# Patient Record
Sex: Female | Born: 1985 | Race: White | Hispanic: No | Marital: Single | Smoking: Never smoker
Health system: Southern US, Community
[De-identification: ages and names within clinical notes are randomized; demographics above are authoritative.]

## PROBLEM LIST (undated history)

## (undated) DIAGNOSIS — I10 Essential (primary) hypertension: Secondary | ICD-10-CM

## (undated) DIAGNOSIS — F419 Anxiety disorder, unspecified: Secondary | ICD-10-CM

## (undated) DIAGNOSIS — E039 Hypothyroidism, unspecified: Secondary | ICD-10-CM

## (undated) HISTORY — DX: Hypothyroidism, unspecified: E03.9

## (undated) HISTORY — PX: CONJUNCTIVA BIOPSY: SHX352

## (undated) HISTORY — DX: Essential (primary) hypertension: I10

## (undated) HISTORY — DX: Anxiety disorder, unspecified: F41.9

---

## 2015-06-17 ENCOUNTER — Encounter (HOSPITAL_COMMUNITY): Payer: Self-pay | Admitting: Adult Health

## 2015-06-17 ENCOUNTER — Emergency Department (HOSPITAL_COMMUNITY)
Admission: EM | Admit: 2015-06-17 | Discharge: 2015-06-17 | Disposition: A | Discharge status: Home / Self Care | Payer: No Typology Code available for payment source | Attending: Emergency Medicine | Admitting: Emergency Medicine

## 2015-06-17 ENCOUNTER — Emergency Department (HOSPITAL_COMMUNITY): Payer: No Typology Code available for payment source

## 2015-06-17 DIAGNOSIS — Z79899 Other long term (current) drug therapy: Secondary | ICD-10-CM | POA: Diagnosis not present

## 2015-06-17 DIAGNOSIS — Y92410 Unspecified street and highway as the place of occurrence of the external cause: Secondary | ICD-10-CM | POA: Insufficient documentation

## 2015-06-17 DIAGNOSIS — S4991XA Unspecified injury of right shoulder and upper arm, initial encounter: Secondary | ICD-10-CM | POA: Insufficient documentation

## 2015-06-17 DIAGNOSIS — Y998 Other external cause status: Secondary | ICD-10-CM | POA: Insufficient documentation

## 2015-06-17 DIAGNOSIS — Y9389 Activity, other specified: Secondary | ICD-10-CM | POA: Insufficient documentation

## 2015-06-17 DIAGNOSIS — S4992XA Unspecified injury of left shoulder and upper arm, initial encounter: Secondary | ICD-10-CM | POA: Diagnosis present

## 2015-06-17 DIAGNOSIS — S40212A Abrasion of left shoulder, initial encounter: Secondary | ICD-10-CM | POA: Diagnosis not present

## 2015-06-17 DIAGNOSIS — S199XXA Unspecified injury of neck, initial encounter: Secondary | ICD-10-CM | POA: Diagnosis not present

## 2015-06-17 DIAGNOSIS — Z3202 Encounter for pregnancy test, result negative: Secondary | ICD-10-CM | POA: Diagnosis not present

## 2015-06-17 LAB — URINE MICROSCOPIC-ADD ON: WBC, UA: NONE SEEN WBC/hpf (ref 0–5)

## 2015-06-17 LAB — URINALYSIS, ROUTINE W REFLEX MICROSCOPIC
BILIRUBIN URINE: NEGATIVE
GLUCOSE, UA: NEGATIVE mg/dL
Ketones, ur: NEGATIVE mg/dL
Leukocytes, UA: NEGATIVE
Nitrite: NEGATIVE
PH: 5.5 (ref 5.0–8.0)
Protein, ur: 30 mg/dL — AB
SPECIFIC GRAVITY, URINE: 1.029 (ref 1.005–1.030)

## 2015-06-17 LAB — POC URINE PREG, ED: PREG TEST UR: NEGATIVE
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
## 2021-08-13 DIAGNOSIS — E876 Hypokalemia: Secondary | ICD-10-CM | POA: Insufficient documentation

## 2021-08-13 DIAGNOSIS — E871 Hypo-osmolality and hyponatremia: Secondary | ICD-10-CM | POA: Insufficient documentation

## 2021-08-13 DIAGNOSIS — I10 Essential (primary) hypertension: Secondary | ICD-10-CM

## 2021-08-13 DIAGNOSIS — F1110 Opioid abuse, uncomplicated: Secondary | ICD-10-CM

## 2021-08-13 DIAGNOSIS — K859 Acute pancreatitis without necrosis or infection, unspecified: Secondary | ICD-10-CM

## 2021-08-13 DIAGNOSIS — I471 Supraventricular tachycardia, unspecified: Secondary | ICD-10-CM

## 2021-08-13 DIAGNOSIS — E861 Hypovolemia: Secondary | ICD-10-CM | POA: Insufficient documentation

## 2021-08-13 DIAGNOSIS — A419 Sepsis, unspecified organism: Secondary | ICD-10-CM

## 2021-08-13 DIAGNOSIS — J9601 Acute respiratory failure with hypoxia: Secondary | ICD-10-CM | POA: Insufficient documentation

## 2021-08-13 DIAGNOSIS — F10930 Alcohol use, unspecified with withdrawal, uncomplicated: Secondary | ICD-10-CM | POA: Insufficient documentation

## 2021-08-13 DIAGNOSIS — Z87898 Personal history of other specified conditions: Secondary | ICD-10-CM

## 2021-08-13 DIAGNOSIS — E872 Acidosis, unspecified: Secondary | ICD-10-CM

## 2021-08-13 DIAGNOSIS — E781 Pure hyperglyceridemia: Secondary | ICD-10-CM

## 2021-08-13 DIAGNOSIS — F1010 Alcohol abuse, uncomplicated: Secondary | ICD-10-CM | POA: Insufficient documentation

## 2021-08-13 DIAGNOSIS — N179 Acute kidney failure, unspecified: Secondary | ICD-10-CM

## 2021-08-13 HISTORY — DX: Pure hyperglyceridemia: E78.1

## 2021-08-13 HISTORY — DX: Acute respiratory failure with hypoxia: J96.01

## 2021-08-13 HISTORY — DX: Alcohol abuse, uncomplicated: F10.10

## 2021-08-13 HISTORY — DX: Sepsis, unspecified organism: A41.9

## 2021-08-13 HISTORY — DX: Essential (primary) hypertension: I10

## 2021-08-13 HISTORY — DX: Hypocalcemia: E83.51

## 2021-08-13 HISTORY — DX: Alcohol use, unspecified with withdrawal, uncomplicated: F10.930

## 2021-08-13 HISTORY — DX: Hypo-osmolality and hyponatremia: E87.1

## 2021-08-13 HISTORY — DX: Acute kidney failure, unspecified: N17.9

## 2021-08-13 HISTORY — DX: Hypokalemia: E87.6

## 2021-08-13 HISTORY — DX: Acute pancreatitis without necrosis or infection, unspecified: K85.90

## 2021-08-13 HISTORY — DX: Supraventricular tachycardia, unspecified: I47.10

## 2021-08-13 HISTORY — DX: Hypomagnesemia: E83.42

## 2021-08-13 HISTORY — DX: Opioid abuse, uncomplicated: F11.10

## 2021-08-13 HISTORY — DX: Hypovolemia: E86.1

## 2021-08-13 HISTORY — DX: Other disorders of phosphorus metabolism: E83.39

## 2021-08-13 HISTORY — DX: Personal history of other specified conditions: Z87.898

## 2021-08-13 HISTORY — DX: Acidosis, unspecified: E87.20

## 2021-10-15 DIAGNOSIS — R1012 Left upper quadrant pain: Secondary | ICD-10-CM | POA: Insufficient documentation

## 2021-10-15 DIAGNOSIS — R109 Unspecified abdominal pain: Secondary | ICD-10-CM | POA: Insufficient documentation

## 2021-10-15 DIAGNOSIS — K8591 Acute pancreatitis with uninfected necrosis, unspecified: Secondary | ICD-10-CM | POA: Insufficient documentation

## 2021-10-15 DIAGNOSIS — F32A Depression, unspecified: Secondary | ICD-10-CM

## 2021-10-15 DIAGNOSIS — I81 Portal vein thrombosis: Secondary | ICD-10-CM | POA: Insufficient documentation

## 2021-10-15 DIAGNOSIS — K863 Pseudocyst of pancreas: Secondary | ICD-10-CM | POA: Insufficient documentation

## 2021-10-15 HISTORY — DX: Acute pancreatitis with uninfected necrosis, unspecified: K85.91

## 2021-10-15 HISTORY — DX: Unspecified abdominal pain: R10.9

## 2021-10-15 HISTORY — DX: Left upper quadrant pain: R10.12

## 2021-10-15 HISTORY — DX: Portal vein thrombosis: I81

## 2021-10-15 HISTORY — DX: Depression, unspecified: F32.A

## 2021-10-15 HISTORY — DX: Pseudocyst of pancreas: K86.3

## 2022-06-10 ENCOUNTER — Encounter: Payer: Self-pay | Admitting: *Deleted

## 2022-06-10 ENCOUNTER — Encounter: Payer: Self-pay | Admitting: Cardiology

## 2022-07-02 DIAGNOSIS — F419 Anxiety disorder, unspecified: Secondary | ICD-10-CM | POA: Insufficient documentation

## 2022-07-02 DIAGNOSIS — E039 Hypothyroidism, unspecified: Secondary | ICD-10-CM | POA: Insufficient documentation

## 2022-07-09 NOTE — Progress Notes (Deleted)
Cardiology Office Note:    Date:  07/09/2022   ID:  Gina Gray, DOB [birth year], MRN [id number]  PCP:  Erskine Emery, NP  Cardiologist:  Norman Herrlich, MD   Referring MD: Erskine Emery, NP  ASSESSMENT:    1. APC (atrial premature contractions)   2. PVC's (premature ventricular contractions)   3. Takotsubo cardiomyopathy   4. Hypertensive heart disease without heart failure   5. Alcohol abuse    PLAN:    In order of problems listed above:  ***  Next appointment   Medication Adjustments/Labs and Tests Ordered: Current medicines are reviewed at length with the patient today.  Concerns regarding medicines are outlined above.  No orders of the defined types were placed in this encounter.  No orders of the defined types were placed in this encounter.    No chief complaint on file. ***  History of Present Illness:    Gina Gray is a 37 y.o. female who is being seen today for the evaluation of arrhythmia at the request of Erskine Emery, NP.  She has a history of alcohol abuse alcohol withdrawal syndrome pancreatitis hypertension hyper lipidemia SVT electrolyte abnormalities  Chart review shows an EKG 08/13/2021 with marked sinus tachycardia 143 bpm right axis deviation with left posterior fascicular block nonspecific ST changes.  An echocardiogram at [facility] 08/14/2021 showing LV dysfunction EF 45 to 50% with images suggesting Takotsubo syndrome or stress-induced cardiomyopathy mid ventricular variant.  Problem was not noted in the discharge summary does not appear to have been seen by cardiology  She was seen with her primary care physician 06/06/2018 for.  Complaint was recurrent upper abdomen and back pain similar to previous pancreatitis.  It was noted that she had sinus arrhythmia PVCs and had an event monitor placed by her primary care physician laboratory studies showed a potassium 4.3 sodium 138 creatinine 0.7 GFR 114 cc/min.  Cholesterol 209 triglycerides  303 LDL 110 HDL 38 lipase level was low at 4 CBC showed hemoglobin of 12.7 platelet count normal 239,000 BUN.  A BNP level was performed which was low at 108.5 the event monitor completed 05/28/2022 showed no episodes of SVT atrial fibrillation heart block sinus pauses or ventricular tachycardia PVC burden was 1% EKG performed 05/22/2022 showed sinus rhythm frequent APCs otherwise normal. Past Medical History:  Diagnosis Date   Acute respiratory failure with hypoxia (HCC) 08/13/2021   AKI (acute kidney injury) (HCC) 08/13/2021   Alcohol withdrawal syndrome without complication (HCC) 08/13/2021   Anxiety disorder    Depression 10/15/2021   Essential (primary) hypertension    History of alcohol use disorder 08/13/2021   Hypertriglyceridemia 08/13/2021   Hypocalcemia 08/13/2021   Hypokalemia 08/13/2021   Hypomagnesemia 08/13/2021   Hyponatremia 08/13/2021   Hypophosphatemia 08/13/2021   Hypothyroidism    Hypovolemia 08/13/2021   Lactic acidosis 08/13/2021   Left upper quadrant abdominal pain 10/15/2021   Opioid abuse (HCC) 08/13/2021   Pancreatitis, unspecified pancreatitis type 08/13/2021   Primary hypertension 08/13/2021   Sepsis (HCC) 08/13/2021   SVT (supraventricular tachycardia) 08/13/2021    No past surgical history on file.  Current Medications: No outpatient medications have been marked as taking for the 07/10/22 encounter (Appointment) with Baldo Daub, MD.     Allergies:   Zosyn [piperacillin sod-tazobactam so]   Social History   Socioeconomic History   Marital status: Single    Spouse name: Not on file   Number of children: Not on file   Years of  education: Not on file   Highest education level: Not on file  Occupational History   Not on file  Tobacco Use   Smoking status: Never   Smokeless tobacco: Never  Substance and Sexual Activity   Alcohol use: No   Drug use: No   Sexual activity: Not on file  Other Topics Concern   Not on file  Social History  Narrative   Not on file   Social Determinants of Health   Financial Resource Strain: Not on file  Food Insecurity: Not on file  Transportation Needs: Not on file  Physical Activity: Not on file  Stress: Not on file  Social Connections: Not on file     Family History: The patient's ***family history includes Breast cancer in her paternal grandmother; Heart disease in her father and mother; Hypertension in her brother.  ROS:   ROS Please see the history of present illness.    *** All other systems reviewed and are negative.  EKGs/Labs/Other Studies Reviewed:    The following studies were reviewed today: ***      EKG:  EKG is *** ordered today.  The ekg ordered today is personally reviewed and demonstrates ***  Recent Labs: No results found for requested labs within last 365 days.  Recent Lipid Panel No results found for: "CHOL", "TRIG", "HDL", "CHOLHDL", "VLDL", "LDLCALC", "LDLDIRECT"  Physical Exam:    VS:  There were no vitals taken for this visit.    Wt Readings from Last 3 Encounters:  05/22/22 284 lb (128.8 kg)     GEN: *** Well nourished, well developed in no acute distress HEENT: Normal NECK: No JVD; No carotid bruits LYMPHATICS: No lymphadenopathy CARDIAC: ***RRR, no murmurs, rubs, gallops RESPIRATORY:  Clear to auscultation without rales, wheezing or rhonchi  ABDOMEN: Soft, non-tender, non-distended MUSCULOSKELETAL:  No edema; No deformity  SKIN: Warm and dry NEUROLOGIC:  Alert and oriented x 3 PSYCHIATRIC:  Normal affect     Signed, Norman Herrlich, MD  07/09/2022 4:20 PM    [facility]

## 2022-07-10 ENCOUNTER — Ambulatory Visit: Payer: Medicaid Other | Attending: Cardiology | Admitting: Cardiology

## 2022-07-10 DIAGNOSIS — I119 Hypertensive heart disease without heart failure: Secondary | ICD-10-CM

## 2022-07-10 DIAGNOSIS — I491 Atrial premature depolarization: Secondary | ICD-10-CM

## 2022-07-10 DIAGNOSIS — F1010 Alcohol abuse, uncomplicated: Secondary | ICD-10-CM

## 2022-07-10 DIAGNOSIS — I5181 Takotsubo syndrome: Secondary | ICD-10-CM

## 2022-07-10 DIAGNOSIS — I493 Ventricular premature depolarization: Secondary | ICD-10-CM

## 2022-07-15 ENCOUNTER — Encounter: Payer: Self-pay | Admitting: Cardiology

## 2022-08-12 DIAGNOSIS — I10 Essential (primary) hypertension: Secondary | ICD-10-CM | POA: Insufficient documentation

## 2022-08-13 NOTE — Progress Notes (Deleted)
Cardiology Office Note:    Date:  08/13/2022   ID:  Gina Gray, DOB 06-Aug-1985, MRN [id number]  PCP:  Erskine Emery, NP  Cardiologist:  Norman Herrlich, MD   Referring MD: Erskine Emery, NP  ASSESSMENT:    No diagnosis found. PLAN:    In order of problems listed above:  ***  Next appointment   Medication Adjustments/Labs and Tests Ordered: Current medicines are reviewed at length with the patient today.  Concerns regarding medicines are outlined above.  No orders of the defined types were placed in this encounter.  No orders of the defined types were placed in this encounter.    No chief complaint on file. ***  History of Present Illness:    Gina Gray is a 37 y.o. female with a history of PVCs noticed on previous EKG who is being seen today for the evaluation of bradycardia at the request of Erskine Emery, NP.  She used a 7-day event monitor in March showing sinus rhythm minimum heart rate 50 bpm no evidence of atrial fibrillation flutter heart block sinus pauses PVC frequency was 1% and triggered events were all sinus rhythm without ectopy or. Past Medical History:  Diagnosis Date   Abdominal pain 10/15/2021   Acute respiratory failure with hypoxia (HCC) 08/13/2021   AKI (acute kidney injury) (HCC) 08/13/2021   Alcohol abuse 08/13/2021   Alcohol withdrawal syndrome without complication (HCC) 08/13/2021   Anxiety disorder    Depression 10/15/2021   Essential (primary) hypertension    History of alcohol use disorder 08/13/2021   Hypertriglyceridemia 08/13/2021   Hypocalcemia 08/13/2021   Hypokalemia 08/13/2021   Hypomagnesemia 08/13/2021   Hyponatremia 08/13/2021   Hypophosphatemia 08/13/2021   Hypothyroidism    Hypovolemia 08/13/2021   Lactic acidosis 08/13/2021   Left upper quadrant abdominal pain 10/15/2021   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   Opioid abuse (HCC) 08/13/2021   Pancreatic pseudocyst 10/15/2021   Pancreatitis, unspecified  pancreatitis type 08/13/2021   Portal vein thrombosis 10/15/2021   Primary hypertension 08/13/2021   Sepsis (HCC) 08/13/2021   SVT (supraventricular tachycardia) 08/13/2021    No past surgical history on file.  Current Medications: No outpatient medications have been marked as taking for the 08/14/22 encounter (Appointment) with Baldo Daub, MD.     Allergies:   Zosyn [piperacillin sod-tazobactam so]   Social History   Socioeconomic History   Marital status: Single    Spouse name: Not on file   Number of children: Not on file   Years of education: Not on file   Highest education level: Not on file  Occupational History   Not on file  Tobacco Use   Smoking status: Never   Smokeless tobacco: Never  Substance and Sexual Activity   Alcohol use: No   Drug use: No   Sexual activity: Not on file  Other Topics Concern   Not on file  Social History Narrative   Not on file   Social Determinants of Health   Financial Resource Strain: Not on file  Food Insecurity: Not on file  Transportation Needs: Not on file  Physical Activity: Not on file  Stress: Not on file  Social Connections: Not on file     Family History: The patient's ***family history includes Breast cancer in her paternal grandmother; Heart disease in her father and mother; Hypertension in her brother.  ROS:   ROS Please see the history of present illness.    *** All other systems reviewed and are negative.  EKGs/Labs/Other Studies Reviewed:    The following studies were reviewed today: ***      EKG:  EKG is *** ordered today.  The ekg ordered today is personally reviewed and demonstrates ***  Recent Labs: No results found for requested labs within last 365 days.  Recent Lipid Panel No results found for: "CHOL", "TRIG", "HDL", "CHOLHDL", "VLDL", "LDLCALC", "LDLDIRECT"  Physical Exam:    VS:  There were no vitals taken for this visit.    Wt Readings from Last 3 Encounters:  05/22/22 284 lb  (128.8 kg)     GEN: *** Well nourished, well developed in no acute distress HEENT: Normal NECK: No JVD; No carotid bruits LYMPHATICS: No lymphadenopathy CARDIAC: ***RRR, no murmurs, rubs, gallops RESPIRATORY:  Clear to auscultation without rales, wheezing or rhonchi  ABDOMEN: Soft, non-tender, non-distended MUSCULOSKELETAL:  No edema; No deformity  SKIN: Warm and dry NEUROLOGIC:  Alert and oriented x 3 PSYCHIATRIC:  Normal affect     Signed, Norman Herrlich, MD  08/13/2022 12:56 PM    [facility]

## 2022-08-14 ENCOUNTER — Ambulatory Visit: Payer: Medicaid Other | Admitting: Cardiology

## 2022-08-28 ENCOUNTER — Ambulatory Visit: Payer: Medicaid Other | Attending: Cardiology | Admitting: Cardiology

## 2022-08-28 ENCOUNTER — Encounter: Payer: Self-pay | Admitting: Cardiology

## 2022-08-28 VITALS — BP 136/78 | HR 67 | Ht 68.0 in | Wt 289.4 lb

## 2022-08-28 DIAGNOSIS — R0609 Other forms of dyspnea: Secondary | ICD-10-CM | POA: Diagnosis not present

## 2022-08-28 DIAGNOSIS — E039 Hypothyroidism, unspecified: Secondary | ICD-10-CM

## 2022-08-28 DIAGNOSIS — I10 Essential (primary) hypertension: Secondary | ICD-10-CM | POA: Diagnosis not present

## 2022-08-28 DIAGNOSIS — I471 Supraventricular tachycardia, unspecified: Secondary | ICD-10-CM

## 2022-08-28 DIAGNOSIS — R0683 Snoring: Secondary | ICD-10-CM

## 2022-08-28 DIAGNOSIS — F1010 Alcohol abuse, uncomplicated: Secondary | ICD-10-CM

## 2022-08-28 NOTE — Patient Instructions (Signed)
Medication Instructions:  Your physician recommends that you continue on your current medications as directed. Please refer to the Current Medication list given to you today.  *If you need a refill on your cardiac medications before your next appointment, please call your pharmacy*   Lab Work: None Ordered If you have labs (blood work) drawn today and your tests are completely normal, you will receive your results only by: MyChart Message (if you have MyChart) OR A paper copy in the mail If you have any lab test that is abnormal or we need to change your treatment, we will call you to review the results.   Testing/Procedures: Your physician has requested that you have an echocardiogram. Echocardiography is a painless test that uses sound waves to create images of your heart. It provides your doctor with information about the size and shape of your heart and how well your heart's chambers and valves are working. This procedure takes approximately one hour. There are no restrictions for this procedure. Please do NOT wear cologne, perfume, aftershave, or lotions (deodorant is allowed). Please arrive 15 minutes prior to your appointment time.    Follow-Up: At [facility], you and your health needs are our priority.  As part of our continuing mission to provide you with exceptional heart care, we have created designated Provider Care Teams.  These Care Teams include your primary Cardiologist (physician) and Advanced Practice Providers (APPs -  Physician Assistants and Nurse Practitioners) who all work together to provide you with the care you need, when you need it.  We recommend signing up for the patient portal called "MyChart".  Sign up information is provided on this After Visit Summary.  MyChart is used to connect with patients for Virtual Visits (Telemedicine).  Patients are able to view lab/test results, encounter notes, upcoming appointments, etc.  Non-urgent messages can be sent to your  provider as well.   To learn more about what you can do with MyChart, go to [email or web address].    Your next appointment:   3 month(s)  The format for your next appointment:   In Person  Provider:   Gypsy Balsam, MD    Other Instructions: Itamar sleep study - will call when insurance authorizes

## 2022-08-28 NOTE — Progress Notes (Signed)
Cardiology Consultation:    Date:  08/28/2022   ID:  Gina Gray, DOB [birth year], MRN [id number]  PCP:  Erskine Emery, NP  Cardiologist:  Gypsy Balsam, MD   Referring MD: Erskine Emery, NP   Chief Complaint  Patient presents with   heart flutter   Sleep Apnea   Fatigue    History of Present Illness:    Gina Gray is a 37 y.o. female who is being seen today for the evaluation of bradycardia, weakness fatigue at the request of Erskine Emery, NP.  Past medical history significant for EtOH abuse, likely she quit few months ago after she presented to the hospital with acute necrotizing pancreatitis.  Since that time she is sober, she also have essential hypertension, she was noted to be bradycardic monitor has been placed on she was found to have bradycardia however none of this is critical.  She denies having any syncope she describes episodes of dizziness when she is getting up.  She complained of having weakness fatigue tiredness denies have any chest pain tightness squeezing pressure burning chest.  She does have some shortness of breath.  She is upset with herself because she gained significant amount of weight within the last few months.  She got 3 children 3 daughters who is helping her to manage her issues recently she returned back to work.  She is Psychologist, counselling.  Past Medical History:  Diagnosis Date   Abdominal pain 10/15/2021   Acute respiratory failure with hypoxia (HCC) 08/13/2021   AKI (acute kidney injury) (HCC) 08/13/2021   Alcohol abuse 08/13/2021   Alcohol withdrawal syndrome without complication (HCC) 08/13/2021   Anxiety disorder    Depression 10/15/2021   Essential (primary) hypertension    History of alcohol use disorder 08/13/2021   Hypertriglyceridemia 08/13/2021   Hypocalcemia 08/13/2021   Hypokalemia 08/13/2021   Hypomagnesemia 08/13/2021   Hyponatremia 08/13/2021   Hypophosphatemia 08/13/2021   Hypothyroidism    Hypovolemia 08/13/2021    Lactic acidosis 08/13/2021   Left upper quadrant abdominal pain 10/15/2021   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   carvedilol (COREG) 12.5 MG tablet Take 12.5 mg by mouth 2 (two) times daily with a meal.   lisinopril (ZESTRIL) 20 MG tablet Take 20 mg by mouth daily.   promethazine (PHENERGAN) 25 MG tablet Take 25 mg by mouth every 6 (six) hours as needed for nausea or vomiting.   sertraline (ZOLOFT) 100 MG tablet Take 100 mg by mouth daily.   sertraline (ZOLOFT) 50 MG tablet Take 50 mg by mouth daily.   Vitamin D, Ergocalciferol, (DRISDOL) 1.25 MG (50000 UNIT) CAPS capsule Take 50,000 Units by mouth every 7 (seven) days.     Allergies:   Zosyn [piperacillin sod-tazobactam so]   Social History   Socioeconomic History   Marital status: Single    Spouse name: Not on file   Number of children: Not on file   Years of education: Not on file   Highest education level: Not  on file  Occupational History   Not on file  Tobacco Use   Smoking status: Never   Smokeless tobacco: Never  Substance and Sexual Activity   Alcohol use: No   Drug use: No   Sexual activity: Yes  Other Topics Concern   Not on file  Social History Narrative   Not on file   Social Determinants of Health   Financial Resource Strain: Not on file  Food Insecurity: Not on file  Transportation Needs: Not on file  Physical Activity: Not on file  Stress: Not on file  Social Connections: Not on  file     Family History: The patient's family history includes Breast cancer in her paternal grandmother; Heart disease in her father and mother; Hypertension in her brother. ROS:   Please see the history of present illness.    All 14 point review of systems negative except as described per history of present illness.  EKGs/Labs/Other Studies Reviewed:    The following studies were reviewed today:  Monitor she worked for 6 days which showed 8 auto triggered events showing supraventricular tachycardia, some PVCs total burden 1%. EKG:  EKG is  ordered today.  The ekg ordered today demonstrates n normal sinus rhythm normal P interval normal QS complex duration morphology, low voltage.  Recent Labs: No results found for requested labs within last 365 days.  Recent Lipid Panel No results found for: "CHOL", "TRIG", "HDL", "CHOLHDL", "VLDL", "LDLCALC", "LDLDIRECT"  Physical Exam:    VS:  BP 136/78 (BP Location: Left Arm, Patient Position: Sitting)   Pulse 67   Ht 5\' 8"  (1.727 m)   Wt 289 lb 6.4 oz (131.3 kg)   SpO2 91%   BMI 44.00 kg/m     Wt Readings from Last 3 Encounters:  08/28/22 289 lb 6.4 oz (131.3 kg)  05/22/22 284 lb (128.8 kg)     GEN:  Well nourished, well developed in no acute distress HEENT: Normal NECK: No JVD; No carotid bruits LYMPHATICS: No lymphadenopathy CARDIAC: RRR, no murmurs, no rubs, no gallops RESPIRATORY:  Clear to auscultation without rales, wheezing or rhonchi  ABDOMEN: Soft, non-tender, non-distended MUSCULOSKELETAL:  No edema; No deformity  SKIN: Warm and dry NEUROLOGIC:  Alert and oriented x 3 PSYCHIATRIC:  Normal affect   ASSESSMENT:    1. SVT (supraventricular tachycardia)   2. Dyspnea on exertion   3. Snoring   4. Primary hypertension   5. Acquired hypothyroidism   6. Alcohol abuse    PLAN:    In order of problems listed above:  SVT not concerning.  She is taking small dose of beta-blocker which I will continue. Sinus  bradycardia and related obviously to beta-blocker.  Not symptomatic I will not do any alteration of her medications. During the conversation discovered she does have a lot of snoring she also wakes up many times during the night he also complained of having excessive daytime somnolence she is falling asleep very easily watching TV.  Will do sleep study. Fatigue tiredness and weakness.  Will schedule her to have echocardiogram to assess left ventricle ejection fraction. History of alcohol abuse.  Likely she quit and she is abstain from drinking the matter-of-fact she started going back to work   Medication Adjustments/Labs and Tests Ordered: Current medicines are reviewed at length with the patient today.  Concerns regarding medicines are outlined above.  Orders Placed This Encounter  Procedures   EKG 12-Lead   ECHOCARDIOGRAM COMPLETE   Itamar Sleep Study  No orders of the defined types were placed in this encounter.   Signed, Georgeanna Lea, MD, [facility]. 08/28/2022 4:21 PM    [facility]

## 2022-09-25 ENCOUNTER — Ambulatory Visit: Payer: Medicaid Other | Attending: Cardiology

## 2022-09-25 ENCOUNTER — Telehealth: Payer: Self-pay | Admitting: Cardiology

## 2022-09-25 DIAGNOSIS — I471 Supraventricular tachycardia, unspecified: Secondary | ICD-10-CM | POA: Diagnosis not present

## 2022-09-25 DIAGNOSIS — R0609 Other forms of dyspnea: Secondary | ICD-10-CM

## 2022-09-25 LAB — ECHOCARDIOGRAM COMPLETE
Area-P 1/2: 4.29 cm2
S' Lateral: 2.4 cm

## 2022-09-25 MED ORDER — PERFLUTREN LIPID MICROSPHERE
1.0000 mL | INTRAVENOUS | Status: AC | PRN
  Administered 2022-09-25: 6 mL via INTRAVENOUS

## 2022-09-25 NOTE — Telephone Encounter (Signed)
Sent 2nd message to Gina Gray Via regarding Sleep study auth

## 2022-09-25 NOTE — Telephone Encounter (Signed)
Patient coming in for echo today and wanted an update on her itamar sleep study and when we thought she might be able to complete it- please call patient at [phone]

## 2022-10-01 NOTE — Telephone Encounter (Signed)
**Note De-Identified Falen Lehrmann Obfuscation** Itamar PA approved Zahava Quant the [employer] portal for [employer]. 7/17-Ready-Valid dates:10/01/2022-12/30/2022 Case [id number]

## 2022-10-02 ENCOUNTER — Telehealth: Payer: Self-pay

## 2022-10-02 NOTE — Telephone Encounter (Signed)
Sleep study authorized per P Via. Pt notified.

## 2022-10-02 NOTE — Telephone Encounter (Signed)
Patient notified through my chart.

## 2022-10-02 NOTE — Telephone Encounter (Signed)
-----   Message from Gypsy Balsam sent at 10/01/2022  8:20 AM EDT ----- Echocardiogram showed normal left ventricle ejection fraction, overall looks good

## 2022-12-03 ENCOUNTER — Ambulatory Visit: Payer: Medicaid Other | Attending: Cardiology | Admitting: Cardiology

## 2023-03-15 ENCOUNTER — Inpatient Hospital Stay (HOSPITAL_COMMUNITY)
Admission: EM | Admit: 2023-03-15 | Discharge: 2023-03-23 | DRG: 439 | Disposition: A | Discharge status: Home / Self Care | Payer: Medicaid Other | Attending: Family Medicine | Admitting: Family Medicine

## 2023-03-15 ENCOUNTER — Other Ambulatory Visit: Payer: Self-pay

## 2023-03-15 ENCOUNTER — Encounter (HOSPITAL_COMMUNITY): Payer: Self-pay | Admitting: Emergency Medicine

## 2023-03-15 DIAGNOSIS — F1090 Alcohol use, unspecified, uncomplicated: Secondary | ICD-10-CM | POA: Insufficient documentation

## 2023-03-15 DIAGNOSIS — I10 Essential (primary) hypertension: Secondary | ICD-10-CM | POA: Diagnosis present

## 2023-03-15 DIAGNOSIS — R0902 Hypoxemia: Secondary | ICD-10-CM | POA: Insufficient documentation

## 2023-03-15 DIAGNOSIS — E871 Hypo-osmolality and hyponatremia: Secondary | ICD-10-CM | POA: Diagnosis present

## 2023-03-15 DIAGNOSIS — R7401 Elevation of levels of liver transaminase levels: Secondary | ICD-10-CM | POA: Insufficient documentation

## 2023-03-15 DIAGNOSIS — E039 Hypothyroidism, unspecified: Secondary | ICD-10-CM | POA: Diagnosis present

## 2023-03-15 DIAGNOSIS — F10139 Alcohol abuse with withdrawal, unspecified: Secondary | ICD-10-CM | POA: Diagnosis present

## 2023-03-15 DIAGNOSIS — L539 Erythematous condition, unspecified: Secondary | ICD-10-CM

## 2023-03-15 DIAGNOSIS — E66813 Obesity, class 3: Secondary | ICD-10-CM | POA: Diagnosis present

## 2023-03-15 DIAGNOSIS — F419 Anxiety disorder, unspecified: Secondary | ICD-10-CM | POA: Diagnosis present

## 2023-03-15 DIAGNOSIS — Z79899 Other long term (current) drug therapy: Secondary | ICD-10-CM

## 2023-03-15 DIAGNOSIS — Z881 Allergy status to other antibiotic agents status: Secondary | ICD-10-CM

## 2023-03-15 DIAGNOSIS — K859 Acute pancreatitis without necrosis or infection, unspecified: Secondary | ICD-10-CM | POA: Diagnosis present

## 2023-03-15 DIAGNOSIS — E7800 Pure hypercholesterolemia, unspecified: Secondary | ICD-10-CM | POA: Diagnosis present

## 2023-03-15 DIAGNOSIS — K760 Fatty (change of) liver, not elsewhere classified: Secondary | ICD-10-CM | POA: Diagnosis present

## 2023-03-15 DIAGNOSIS — E781 Pure hyperglyceridemia: Secondary | ICD-10-CM | POA: Diagnosis present

## 2023-03-15 DIAGNOSIS — F1120 Opioid dependence, uncomplicated: Secondary | ICD-10-CM | POA: Diagnosis present

## 2023-03-15 DIAGNOSIS — F329 Major depressive disorder, single episode, unspecified: Secondary | ICD-10-CM | POA: Diagnosis present

## 2023-03-15 DIAGNOSIS — K852 Alcohol induced acute pancreatitis without necrosis or infection: Principal | ICD-10-CM | POA: Diagnosis present

## 2023-03-15 DIAGNOSIS — Z6841 Body Mass Index (BMI) 40.0 and over, adult: Secondary | ICD-10-CM

## 2023-03-15 LAB — COMPREHENSIVE METABOLIC PANEL
ALT: 81 U/L — ABNORMAL HIGH (ref 0–44)
AST: 132 U/L — ABNORMAL HIGH (ref 15–41)
Albumin: 3.9 g/dL (ref 3.5–5.0)
Alkaline Phosphatase: 128 U/L — ABNORMAL HIGH (ref 38–126)
Anion gap: 10 (ref 5–15)
BUN: 9 mg/dL (ref 6–20)
CO2: 21 mmol/L — ABNORMAL LOW (ref 22–32)
Calcium: 9.2 mg/dL (ref 8.9–10.3)
Chloride: 101 mmol/L (ref 98–111)
Creatinine, Ser: 0.7 mg/dL (ref 0.44–1.00)
GFR, Estimated: >60 mL/min (ref >60)
Glucose, Bld: 124 mg/dL — ABNORMAL HIGH (ref 70–99)
Potassium: 3.7 mmol/L (ref 3.5–5.1)
Sodium: 132 mmol/L — ABNORMAL LOW (ref 135–145)
Total Bilirubin: 1.2 mg/dL — ABNORMAL HIGH (ref <1.2)
Total Protein: 8 g/dL (ref 6.5–8.1)

## 2023-03-15 LAB — URINALYSIS, ROUTINE W REFLEX MICROSCOPIC
Bilirubin Urine: NEGATIVE
Glucose, UA: NEGATIVE mg/dL
Ketones, ur: NEGATIVE mg/dL
Leukocytes,Ua: NEGATIVE
Nitrite: NEGATIVE
Protein, ur: 100 mg/dL — AB
Specific Gravity, Urine: 1.028 (ref 1.005–1.030)
pH: 6 (ref 5.0–8.0)

## 2023-03-15 LAB — CBC
HCT: 44.1 % (ref 36.0–46.0)
Hemoglobin: 15.7 g/dL — ABNORMAL HIGH (ref 12.0–15.0)
MCH: 32.1 pg (ref 26.0–34.0)
MCHC: 35.6 g/dL (ref 30.0–36.0)
MCV: 90.2 fL (ref 80.0–100.0)
Platelets: 228 10*3/uL (ref 150–400)
RBC: 4.89 MIL/uL (ref 3.87–5.11)
RDW: 12.2 % (ref 11.5–15.5)
WBC: 8.2 10*3/uL (ref 4.0–10.5)
nRBC: 0 % (ref 0.0–0.2)

## 2023-03-15 LAB — HCG, SERUM, QUALITATIVE: Preg, Serum: NEGATIVE

## 2023-03-15 LAB — ETHANOL: Alcohol, Ethyl (B): <10 mg/dL (ref <10)

## 2023-03-15 LAB — LIPASE, BLOOD: Lipase: 57 U/L — ABNORMAL HIGH (ref 11–51)

## 2023-03-15 MED ORDER — OXYCODONE-ACETAMINOPHEN 5-325 MG PO TABS
2.0000 | ORAL_TABLET | Freq: Once | ORAL | Status: AC
  Administered 2023-03-16: 2 via ORAL
  Filled 2023-03-15: qty 2

## 2023-03-15 MED ORDER — ONDANSETRON HCL 4 MG/2ML IJ SOLN
4.0000 mg | Freq: Once | INTRAMUSCULAR | Status: AC
  Administered 2023-03-16: 4 mg via INTRAVENOUS
  Filled 2023-03-15: qty 2

## 2023-03-15 NOTE — ED Provider Triage Note (Signed)
Emergency Medicine Provider Triage Evaluation Note  Gina Gray , a 37 y.o. female  was evaluated in triage.  Pt complains of ab pain. Hx of alcohol induced pancreatitis. Has been drinking a fifth of alcohol a day. Has been vomiting.   Review of Systems  Positive: Ab pain, vomiting  Negative: fever  Physical Exam  BP (!) 176/113 (BP Location: Left Arm)   Pulse 84   Temp 98.5 F (36.9 C) (Oral)   Resp 16   Ht 5\' 8"  (1.727 m)   Wt 131.3 kg   SpO2 97%   BMI 44.01 kg/m  Gen:   Awake, uncomfortable  Resp:  Normal effort  MSK:   Moves extremities without difficulty  Other:  + epigastric tenderness   Medical Decision Making  Medically screening exam initiated at 9:38 PM.  Appropriate orders placed.  Gina Gray was informed that the remainder of the evaluation will be completed by another provider, this initial triage assessment does not replace that evaluation, and the importance of remaining in the ED until their evaluation is complete.  Gina Gray is a 37 y.o. female here with ab pain. Likely alcohol induced pancreatitis. Will get cbc, cmp, lipase, CT ab/pel. Stable for waiting room     Charlynne Pander, MD 03/15/23 2140

## 2023-03-15 NOTE — ED Triage Notes (Signed)
Per EMS, pt from home, abdominal pain that radiates to back, feels like pancreatitis.  Pt admits to alcohol this morning   173/130 10/10pn  82SR 20G R AC 4mg  zofran

## 2023-03-16 ENCOUNTER — Observation Stay (HOSPITAL_COMMUNITY): Payer: Medicaid Other

## 2023-03-16 ENCOUNTER — Emergency Department (HOSPITAL_COMMUNITY): Payer: Medicaid Other

## 2023-03-16 DIAGNOSIS — F1090 Alcohol use, unspecified, uncomplicated: Secondary | ICD-10-CM | POA: Insufficient documentation

## 2023-03-16 DIAGNOSIS — K852 Alcohol induced acute pancreatitis without necrosis or infection: Secondary | ICD-10-CM | POA: Diagnosis not present

## 2023-03-16 DIAGNOSIS — K859 Acute pancreatitis without necrosis or infection, unspecified: Secondary | ICD-10-CM | POA: Diagnosis present

## 2023-03-16 LAB — CBC
HCT: 42 % (ref 36.0–46.0)
Hemoglobin: 14.6 g/dL (ref 12.0–15.0)
MCH: 31.5 pg (ref 26.0–34.0)
MCHC: 34.8 g/dL (ref 30.0–36.0)
MCV: 90.5 fL (ref 80.0–100.0)
Platelets: 199 10*3/uL (ref 150–400)
RBC: 4.64 MIL/uL (ref 3.87–5.11)
RDW: 12.4 % (ref 11.5–15.5)
WBC: 6.7 10*3/uL (ref 4.0–10.5)
nRBC: 0 % (ref 0.0–0.2)

## 2023-03-16 LAB — CREATININE, SERUM
Creatinine, Ser: 0.66 mg/dL (ref 0.44–1.00)
GFR, Estimated: >60 mL/min (ref >60)

## 2023-03-16 LAB — HIV ANTIBODY (ROUTINE TESTING W REFLEX): HIV Screen 4th Generation wRfx: NONREACTIVE

## 2023-03-16 MED ORDER — MORPHINE SULFATE (PF) 4 MG/ML IV SOLN
4.0000 mg | Freq: Once | INTRAVENOUS | Status: AC
  Administered 2023-03-16: 4 mg via INTRAVENOUS
  Filled 2023-03-16: qty 1

## 2023-03-16 MED ORDER — PANTOPRAZOLE SODIUM 40 MG IV SOLR
40.0000 mg | Freq: Once | INTRAVENOUS | Status: AC
Start: 2023-03-16 — End: 2023-03-16
  Administered 2023-03-16: 40 mg via INTRAVENOUS
  Filled 2023-03-16: qty 10

## 2023-03-16 MED ORDER — BUPRENORPHINE HCL-NALOXONE HCL 2-0.5 MG SL SUBL
2.0000 | SUBLINGUAL_TABLET | Freq: Four times a day (QID) | SUBLINGUAL | Status: DC
  Administered 2023-03-16 – 2023-03-23 (×26): 2 via SUBLINGUAL
  Filled 2023-03-16 (×26): qty 2

## 2023-03-16 MED ORDER — ACETAMINOPHEN 10 MG/ML IV SOLN
1000.0000 mg | Freq: Four times a day (QID) | INTRAVENOUS | Status: AC
  Administered 2023-03-16 – 2023-03-17 (×4): 1000 mg via INTRAVENOUS
  Filled 2023-03-16 (×4): qty 100

## 2023-03-16 MED ORDER — HYDROMORPHONE HCL 1 MG/ML IJ SOLN
0.5000 mg | INTRAMUSCULAR | Status: DC | PRN
  Administered 2023-03-16 – 2023-03-17 (×5): 1 mg via INTRAVENOUS
  Filled 2023-03-16 (×5): qty 1

## 2023-03-16 MED ORDER — LACTATED RINGERS IV BOLUS
1000.0000 mL | Freq: Once | INTRAVENOUS | Status: AC
  Administered 2023-03-16: 1000 mL via INTRAVENOUS

## 2023-03-16 MED ORDER — ENOXAPARIN SODIUM 40 MG/0.4ML IJ SOSY
40.0000 mg | PREFILLED_SYRINGE | INTRAMUSCULAR | Status: DC
  Administered 2023-03-16 – 2023-03-17 (×2): 40 mg via SUBCUTANEOUS
  Filled 2023-03-16 (×2): qty 0.4

## 2023-03-16 MED ORDER — ONDANSETRON 4 MG PO TBDP
8.0000 mg | ORAL_TABLET | Freq: Once | ORAL | Status: AC
  Administered 2023-03-16: 8 mg via ORAL
  Filled 2023-03-16: qty 2

## 2023-03-16 MED ORDER — ADULT MULTIVITAMIN W/MINERALS CH
1.0000 | ORAL_TABLET | Freq: Every day | ORAL | Status: DC
  Administered 2023-03-17 – 2023-03-23 (×7): 1 via ORAL
  Filled 2023-03-16 (×8): qty 1

## 2023-03-16 MED ORDER — IOHEXOL 350 MG/ML SOLN
75.0000 mL | Freq: Once | INTRAVENOUS | Status: AC | PRN
  Administered 2023-03-16: 75 mL via INTRAVENOUS

## 2023-03-16 MED ORDER — HYDROMORPHONE HCL 1 MG/ML IJ SOLN
1.0000 mg | Freq: Once | INTRAMUSCULAR | Status: AC
  Administered 2023-03-16: 1 mg via INTRAVENOUS
  Filled 2023-03-16: qty 1

## 2023-03-16 MED ORDER — ACETAMINOPHEN 500 MG PO TABS: 1000.0000 mg | ORAL_TABLET | Freq: Four times a day (QID) | ORAL | Status: DC | PRN

## 2023-03-16 MED ORDER — FOLIC ACID 1 MG PO TABS
1.0000 mg | ORAL_TABLET | Freq: Every day | ORAL | Status: DC
Start: 2023-03-16 — End: 2023-03-23
  Administered 2023-03-17 – 2023-03-23 (×7): 1 mg via ORAL
  Filled 2023-03-16 (×8): qty 1

## 2023-03-16 MED ORDER — ONDANSETRON HCL 4 MG PO TABS
4.0000 mg | ORAL_TABLET | Freq: Four times a day (QID) | ORAL | Status: DC | PRN
Start: 2023-03-16 — End: 2023-03-23
  Administered 2023-03-17 – 2023-03-21 (×4): 4 mg via ORAL
  Filled 2023-03-16 (×4): qty 1

## 2023-03-16 MED ORDER — ONDANSETRON HCL 4 MG/2ML IJ SOLN
4.0000 mg | Freq: Four times a day (QID) | INTRAMUSCULAR | Status: DC | PRN
  Administered 2023-03-16 – 2023-03-20 (×6): 4 mg via INTRAVENOUS
  Filled 2023-03-16 (×7): qty 2

## 2023-03-16 MED ORDER — MELATONIN 3 MG PO TABS: 3.0000 mg | ORAL_TABLET | Freq: Every evening | ORAL | Status: DC | PRN

## 2023-03-16 MED ORDER — LACTATED RINGERS IV SOLN: INTRAVENOUS | Status: AC

## 2023-03-16 MED ORDER — THIAMINE MONONITRATE 100 MG PO TABS
100.0000 mg | ORAL_TABLET | Freq: Every day | ORAL | Status: DC
  Administered 2023-03-17 – 2023-03-23 (×7): 100 mg via ORAL
  Filled 2023-03-16 (×8): qty 1

## 2023-03-16 MED ORDER — ACETAMINOPHEN 650 MG RE SUPP
650.0000 mg | Freq: Four times a day (QID) | RECTAL | Status: DC | PRN
Start: 2023-03-16 — End: 2023-03-16

## 2023-03-16 NOTE — Assessment & Plan Note (Signed)
Initially elevated likely in the setting of pain.  Holding home BP meds while NPO. -Restart home lisinopril and carvedilol when able

## 2023-03-16 NOTE — H&P (Addendum)
Hospital Admission History and Physical Service Pager: [phone]  Patient name: Gina Gray Medical record number: [id number] Date of Birth: 01/25/1986 Age: 37 y.o. Gender: female  Primary Care Provider: Erskine Emery, NP Consultants: None Code Status: Full code Preferred Emergency Contact: Lurene Shadow [phone] (children's father) Contact Information     Name Relation Home Work Mobile   Beal,Connie Mother [phone]  [phone]      Other Contacts   None on File      Chief Complaint: Epigastric/RUQ abdominal pain  Assessment and Plan: Gina Gray is a 37 y.o. female presenting with epigastric/RUQ abdominal pain and N/V and found to have pancreatitis on imaging. Differential for presentation of this includes pancreatitis, cholecystitis, PUD and gastroparesis.  Imaging with evidence of pancreatitis makes this most likely cause, though lipase is not extraordinarily elevated at 57.  Most likely secondary to alcohol use disorder, h/o alcohol induced necrotizing pancreatitis in 09/2021.  Cannot exclude gallstone pancreatitis given elevated LFTs and demographic will obtain further imaging to rule out.  Obtain lipid panel to rule out hypertriglyceridemia. No evidence of cholecystitis on imaging thus far however cannot definitively rule out. PUD less likely without presence of characteristic symptoms (no evidence of blood/tarry stools, no change in amount or quality of pain following eating).  For similar reasons, gastroparesis is less likely. Assessment & Plan Pancreatitis Likely in the setting of alcohol use disorder but cannot rule out gallstone etiology. H/o necrotizing pancreatitis in 09/2021, required high-dose IV opioids during that time due to con commitment history of OUD on Suboxone and anticipate similar needs during this hospitalization.  Will treat pain aggressively, rehydrate and advance diet as tolerated. - Admit to MedSurg, Dr. Miquel Dunn attending - RUQ [place] to  assess for gallstones - Lipid panel to rule out hypertriglyceridemia origin - NPO at this time; LR maintenance IV fluids (200 mL/h) - Sch Tylenol IV 1000mg  every 6 hours - IV Dilaudid every 4 hours prn, may consider pain regimen adjustment given home use of Suboxone 8-2 BID - Intend to add p.o. oxycodone 5 mg once patient can tolerate oral medications - IV Zofran every 6 hours as needed - AM CBC, CMP, lipid panel, magnesium Alcohol use disorder Drinking fifth of liquor daily x 5 months.  Her last drink was 12/29 at 10 AM (slightly more than 24 hours prior to admission).  Denies h/o withdrawal seizures and Dts. She does desire to stop drinking completely. - CIWA every 4 hours without Ativan orders - Start folic acid, multivitamin, thiamine supplementation when patient is able to tolerate PO - Consult TOC for substance use counseling HTN (hypertension) Initially elevated likely in the setting of pain.  Holding home BP meds while NPO. -Restart home lisinopril and carvedilol when able  Chronic and Stable Problems:  Opioid dependence: Pain regimen as above MDD: Hold home sertraline 150 mg, restart when able   FEN/GI: NPO VTE Prophylaxis: Lovenox  Disposition: MedSurg  History of Present Illness:  Gina Gray is a 37 y.o. female presenting with abdominal pain.  She reports 5 months of excessive drinking.  States she started to feel poorly about 1 week ago with central abdominal pain that radiated to her back; this is similar to how she felt last time she had pancreatitis.  She has had nausea and vomiting since that time and has had difficulty keeping food down.  She has not eaten in the last couple of days.  She is staying hydrated and reports urinating at least a  few times per day.  She has been drinking approximately fifth of liquor each day.  Additionally reports some chest tightness which she attributes to anxiety.  No history of gallstones.   In the ED, patient underwent CT A/P which  revealed mild changes associated with pancreatitis.  Lipase minimally elevated at 57.  Hemoglobin 15.7, and CMP with mild hyponatremia and elevated LFTs.  Ethanol level negative.  Patient received 1 L of IVF, antiemetics, and pain medication however she continued to be in a significant amount of pain.  Review Of Systems: Per HPI  Pertinent Past Medical History: HTN GAD MDD Remainder reviewed in history tab.   Pertinent Past Surgical History: None Remainder reviewed in history tab.   Pertinent Social History: Tobacco use: No  Alcohol use: Yes, excessively. 1/5 liquor per day. 12/29 around 10AM. Denies withdrawal seizures and DTs.  Other Substance use: None currently. Prior substance use with pain pills Lives with children (3 girls) and children's father  Pertinent Family History: None Remainder reviewed in history tab.   Important Outpatient Medications: Lisinopril 20mg   Carvedilol 12.5mg  BID Sertraline 150mg  Suboxone 8-2 BID - taken on Friday Remainder reviewed in medication history.   Objective: BP 124/72   Pulse 82   Temp 98.3 F (36.8 C) (Oral)   Resp 18   Ht 5\' 8"  (1.727 m)   Wt 131.3 kg   SpO2 95%   BMI 44.01 kg/m  Exam: General: Very uncomfortable-appearing, no acute distress. HEENT: normocephalic, EOM grossly intact.  Face significantly flushed with diffuse sweating. Cardio: Regular rate, regular rhythm, no murmurs on exam. Pulm: Clear, no wheezing, no crackles. No increased work of breathing. Abdominal: Hypoactive bowel sounds.  Soft, but diffusely tender throughout.  Extremities: no peripheral edema. Moves all extremities equally. Neuro: Alert and oriented x3, speech normal in content. Psych:  Cognition and judgment appear intact. Alert, communicative, and cooperative with normal attention span and concentration.   Labs:  CBC BMET  Recent Labs  Lab 03/15/23 2136  WBC 8.2  HGB 15.7*  HCT 44.1  PLT 228   Recent Labs  Lab 03/15/23 2136  NA 132*   K 3.7  CL 101  CO2 21*  BUN 9  CREATININE 0.70  GLUCOSE 124*  CALCIUM 9.2    Lipase 57   CT ABDOMEN PELVIS W CONTRAST Result Date: 03/16/2023 IMPRESSION: Mild changes of pancreatitis in the head and uncinate process with some associated duodenal inflammation. Fatty liver.   Cyndia Skeeters, DO 03/16/2023, 2:53 PM PGY-1, [facility] Family Medicine  FPTS Intern pager: [phone], text pages welcome Secure chat group [facility] Teaching Service   Upper Level Addendum:   I have seen and evaluated this patient along with Dr. Mliss Sax and reviewed the above note, making necessary revisions as appropriate.  I agree with the medical decision making and physical exam as noted above.   Elberta Fortis, DO PGY-2, [facility] Residency

## 2023-03-16 NOTE — ED Notes (Signed)
ED TO INPATIENT HANDOFF REPORT  ED Nurse Name and Phone #: Dahlia Client [phone]  S Name/Age/Gender Gina Gray 37 y.o. female Room/Bed: 017C/017C  Code Status   Code Status: Not on file  Home/SNF/Other Home Patient oriented to: self, place, time, and situation Is this baseline? Yes   Triage Complete: Triage complete  Chief Complaint Pancreatitis [K85.90]  Triage Note Per EMS, pt from home, abdominal pain that radiates to back, feels like pancreatitis.  Pt admits to alcohol this morning   173/130 10/10pn  82SR 20G R AC 4mg  zofran    Allergies Allergies  Allergen Reactions   Zosyn [Piperacillin Sod-Tazobactam So] Other (See Comments)    Fever    Level of Care/Admitting Diagnosis ED Disposition     ED Disposition  Admit   Condition  --   Comment  Hospital Area: [facility] [[id number]]  Level of Care: Med-Surg [16]  May admit patient to [facility] or [facility] if equivalent level of care is available:: No  Covid Evaluation: Asymptomatic - no recent exposure (last 10 days) testing not required  Diagnosis: Pancreatitis [202663]  Admitting Physician: Billey Co [[id number]]  Attending Physician: Billey Co [[id number]]  Certification:: I certify this patient will need inpatient services for at least 2 midnights  Expected Medical Readiness: 03/18/2023          B Medical/Surgery History Past Medical History:  Diagnosis Date   Abdominal pain 10/15/2021   Acute respiratory failure with hypoxia (HCC) 08/13/2021   AKI (acute kidney injury) (HCC) 08/13/2021   Alcohol abuse 08/13/2021   Alcohol withdrawal syndrome without complication (HCC) 08/13/2021   Anxiety disorder    Depression 10/15/2021   Essential (primary) hypertension    History of alcohol use disorder 08/13/2021   Hypertriglyceridemia 08/13/2021   Hypocalcemia 08/13/2021   Hypokalemia 08/13/2021   Hypomagnesemia 08/13/2021   Hyponatremia 08/13/2021   Hypophosphatemia  08/13/2021   Hypothyroidism    Hypovolemia 08/13/2021   Lactic acidosis 08/13/2021   Left upper quadrant abdominal pain 10/15/2021   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   Opioid abuse (HCC) 08/13/2021   Pancreatic pseudocyst 10/15/2021   Pancreatitis, unspecified pancreatitis type 08/13/2021   Portal vein thrombosis 10/15/2021   Primary hypertension 08/13/2021   Sepsis (HCC) 08/13/2021   SVT (supraventricular tachycardia) (HCC) 08/13/2021   Past Surgical History:  Procedure Laterality Date   CONJUNCTIVA BIOPSY       A IV Location/Drains/Wounds Patient Lines/Drains/Airways Status     Active Line/Drains/Airways     Name Placement date Placement time Site Days   Peripheral IV 03/16/23 18 G Right Antecubital 03/16/23  0050  Antecubital  less than 1            Intake/Output Last 24 hours No intake or output data in the 24 hours ending 03/16/23 1247  Labs/Imaging Results for orders placed or performed during the hospital encounter of 03/15/23 (from the past 48 hours)  Lipase, blood     Status: Abnormal   Collection Time: 03/15/23  9:36 PM  Result Value Ref Range   Lipase 57 (H) 11 - 51 U/L    Comment: Performed at [facility], [address] [place], [place] [address]  Comprehensive metabolic panel     Status: Abnormal   Collection Time: 03/15/23  9:36 PM  Result Value Ref Range   Sodium 132 (L) 135 - 145 mmol/L   Potassium 3.7 3.5 - 5.1 mmol/L   Chloride 101 98 - 111 mmol/L   CO2 21 (  L) 22 - 32 mmol/L   Glucose, Bld 124 (H) 70 - 99 mg/dL    Comment: Glucose reference range applies only to samples taken after fasting for at least 8 hours.   BUN 9 6 - 20 mg/dL   Creatinine, Ser 8.11 0.44 - 1.00 mg/dL   Calcium 9.2 8.9 - 91.4 mg/dL   Total Protein 8.0 6.5 - 8.1 g/dL   Albumin 3.9 3.5 - 5.0 g/dL   AST 782 (H) 15 - 41 U/L   ALT 81 (H) 0 - 44 U/L   Alkaline Phosphatase 128 (H) 38 - 126 U/L   Total Bilirubin 1.2 (H) <1.2 mg/dL   GFR, Estimated >95 >62 mL/min     Comment: (NOTE) Calculated using the CKD-EPI Creatinine Equation (2021)    Anion gap 10 5 - 15    Comment: Performed at [facility], [address] [place], [place] [address]  CBC     Status: Abnormal   Collection Time: 03/15/23  9:36 PM  Result Value Ref Range   WBC 8.2 4.0 - 10.5 K/uL   RBC 4.89 3.87 - 5.11 MIL/uL   Hemoglobin 15.7 (H) 12.0 - 15.0 g/dL   HCT 57.8 46.9 - 62.9 %   MCV 90.2 80.0 - 100.0 fL   MCH 32.1 26.0 - 34.0 pg   MCHC 35.6 30.0 - 36.0 g/dL   RDW 52.8 41.3 - 24.4 %   Platelets 228 150 - 400 K/uL   nRBC 0.0 0.0 - 0.2 %    Comment: Performed at [facility], [address] [place], [place] [address]  hCG, serum, qualitative     Status: None   Collection Time: 03/15/23  9:36 PM  Result Value Ref Range   Preg, Serum NEGATIVE NEGATIVE    Comment:        THE SENSITIVITY OF THIS METHODOLOGY IS >10 mIU/mL. Performed at [facility], [address], [place], [place] [address]   Urinalysis, Routine w reflex microscopic -Urine, Clean Catch     Status: Abnormal   Collection Time: 03/15/23  9:38 PM  Result Value Ref Range   Color, Urine AMBER (A) YELLOW    Comment: BIOCHEMICALS MAY BE AFFECTED BY COLOR   APPearance HAZY (A) CLEAR   Specific Gravity, Urine 1.028 1.005 - 1.030   pH 6.0 5.0 - 8.0   Glucose, UA NEGATIVE NEGATIVE mg/dL   Hgb urine dipstick SMALL (A) NEGATIVE   Bilirubin Urine NEGATIVE NEGATIVE   Ketones, ur NEGATIVE NEGATIVE mg/dL   Protein, ur 440 (A) NEGATIVE mg/dL   Nitrite NEGATIVE NEGATIVE   Leukocytes,Ua NEGATIVE NEGATIVE   RBC / HPF 11-20 0 - 5 RBC/hpf   WBC, UA 0-5 0 - 5 WBC/hpf   Bacteria, UA RARE (A) NONE SEEN   Squamous Epithelial / HPF 0-5 0 - 5 /HPF   Mucus PRESENT     Comment: Performed at [facility], [address] [place], [place] [address]  Ethanol     Status: None   Collection Time: 03/15/23  9:42 PM  Result Value Ref Range   Alcohol, Ethyl (B) <10 <10 mg/dL    Comment: (NOTE) Lowest detectable  limit for serum alcohol is 10 mg/dL.  For medical purposes only. Performed at [facility], [address] [place], [place] [address]    CT ABDOMEN PELVIS W CONTRAST Result Date: 03/16/2023 CLINICAL DATA:  Abdominal pain EXAM: CT ABDOMEN AND PELVIS WITH CONTRAST TECHNIQUE: Multidetector CT  imaging of the abdomen and pelvis was performed using the standard protocol following bolus administration of intravenous contrast. RADIATION DOSE REDUCTION: This exam was performed according to the departmental dose-optimization program which includes automated exposure control, adjustment of the mA and/or kV according to patient size and/or use of iterative reconstruction technique. CONTRAST:  75mL OMNIPAQUE IOHEXOL 350 MG/ML SOLN COMPARISON:  10/30/2021 FINDINGS: Lower chest: No acute abnormality. Hepatobiliary: Fatty infiltration of the liver is noted. The gallbladder is within normal limits. Pancreas: Pancreas is well visualized. Very minimal peripancreatic inflammatory changes are noted surrounding the head and uncinate process. Phlegmon is noted. No pseudocyst is seen. Spleen: Normal in size without focal abnormality. Adrenals/Urinary Tract: Adrenal glands are within normal limits. Kidneys demonstrate a normal enhancement pattern bilaterally. No renal calculi or obstructive changes are seen. The bladder is partially distended. Stomach/Bowel: The appendix is within normal limits. No obstructive or inflammatory changes of the colon are seen. The stomach is within normal limits. Mild reactive inflammatory changes of the second portion of the duodenum are seen. No other small bowel abnormality is noted. Vascular/Lymphatic: No significant vascular findings are present. No enlarged abdominal or pelvic lymph nodes. Reproductive: Uterus and bilateral adnexa are unremarkable. IUD is noted in place. Other: No abdominal wall hernia or abnormality. No abdominopelvic ascites. Musculoskeletal: No acute or significant  osseous findings. IMPRESSION: Mild changes of pancreatitis in the head and uncinate process with some associated duodenal inflammation. Fatty liver. Electronically Signed   By: Alcide Clever M.D.   On: 03/16/2023 01:11    Pending Labs Unresulted Labs (From admission, onward)    None       Vitals/Pain Today's Vitals   03/16/23 0836 03/16/23 0848 03/16/23 1221 03/16/23 1237  BP: (!) 180/114     Pulse: 80     Resp: (!) 22     Temp: 97.6 F (36.4 C)   98.2 F (36.8 C)  TempSrc: Oral   Oral  SpO2: 100%     Weight:      Height:      PainSc:  9  7      Isolation Precautions No active isolations  Medications Medications  ondansetron (ZOFRAN) injection 4 mg (4 mg Intravenous Given 03/16/23 0119)  oxyCODONE-acetaminophen (PERCOCET/ROXICET) 5-325 MG per tablet 2 tablet (2 tablets Oral Given 03/16/23 0119)  iohexol (OMNIPAQUE) 350 MG/ML injection 75 mL (75 mLs Intravenous Contrast Given 03/16/23 0050)  morphine (PF) 4 MG/ML injection 4 mg (4 mg Intravenous Given 03/16/23 0851)  ondansetron (ZOFRAN-ODT) disintegrating tablet 8 mg (8 mg Oral Given 03/16/23 0850)  lactated ringers bolus 1,000 mL (0 mLs Intravenous Stopped 03/16/23 1001)  pantoprazole (PROTONIX) injection 40 mg (40 mg Intravenous Given 03/16/23 0849)  HYDROmorphone (DILAUDID) injection 1 mg (1 mg Intravenous Given 03/16/23 1021)    Mobility walks     Focused Assessments     R Recommendations: See Admitting Provider Note  Report given to:   Additional Notes:

## 2023-03-16 NOTE — Plan of Care (Signed)
FMTS Interim Progress Note  S: Went to bedside with Dr. Laroy Apple to see patient. She is doing alright. Reports that she received some pain medicine recently which has helped keep her pain at bay. No complaints of nausea or vomiting. She has been trying to get some rest, but has had some difficulty doing so.   Reports her last drink was Sunday morning and has no history of alcohol withdrawal seizures. Has had diaphoresis and tremors in the past, but denies any at this time.   O: BP 127/79 (BP Location: Right Arm)   Pulse 85   Temp 98.1 F (36.7 C) (Oral)   Resp 18   Ht 5\' 8"  (1.727 m)   Wt 131.3 kg   SpO2 97%   BMI 44.01 kg/m   General: Awake and Alert in NAD.  HEENT: NCAT. Sclera anicteric. No rhinorrhea. Face flushed with diffuse sweating Cardiovascular: RRR. No M/R/G Respiratory: CTAB, normal WOB on RA. No wheezing, crackles, rhonchi, or diminished breath sounds. Abdomen: Soft, non-tender, non-distended. Bowel sounds normoactive. Extremities: Able to move all extremities equally. No BLE edema, no deformities or significant joint findings. Mild bilateral tremors noted. Skin: Warm and dry. No abrasions or rashes noted. Neuro: A&Ox3. No focal neurological deficits.  A/P: Pancreatitis - Pain Regimen: Home Suboxone 2-0.5 mg BID, IV Dilaudid q4h PRN, IV Tylenol 1000 mg q6h, will continue to monitor pain overnight and adjust pain regimen if indicated - Continue IVF (200 mL/h) - Melatonin at bedtime PRN  Alcohol Use Disorder Mild bilateral tremors noted on exam. Will continue to monitor for withdrawal symptoms.  - CIWA q4h without Ativan  HTN Improved after admission. Normotensive at this time.  Fortunato Curling, DO 03/16/2023, 9:40 PM PGY-1, [facility] Family Medicine Service pager [phone]

## 2023-03-16 NOTE — Progress Notes (Signed)
Transition of Care [facility]) - Inpatient Brief Assessment   Patient Details  Name: Gina Gray MRN: [id number] Date of Birth: 09-16-1985  Transition of Care [facility]) CM/SW Contact:    Janae Bridgeman, RN Phone Number: 03/16/2023, 3:51 PM   Clinical Narrative: Patient admitted to the hospital with abdominal pain.  Patient with history of ETOH use.  Resources provided.  No TOC needs at this time.   Transition of Care Asessment: Insurance and Status: (P) Insurance coverage has been reviewed Patient has primary care physician: (P) Yes Home environment has been reviewed: (P) from home Prior level of function:: (P) Independent Prior/Current Home Services: (P) No current home services Social Drivers of Health Review: (P) SDOH reviewed interventions complete Readmission risk has been reviewed: (P) Yes Transition of care needs: (P) no transition of care needs at this time

## 2023-03-16 NOTE — Assessment & Plan Note (Addendum)
Drinking fifth of liquor daily x 5 months.  Her last drink was 12/29 at 10 AM (slightly more than 24 hours prior to admission).  Denies h/o withdrawal seizures and Dts. She does desire to stop drinking completely. - CIWA every 4 hours without Ativan orders - Start folic acid, multivitamin, thiamine supplementation when patient is able to tolerate PO - Consult TOC for substance use counseling

## 2023-03-16 NOTE — ED Provider Notes (Addendum)
[place] EMERGENCY DEPARTMENT AT [facility] Provider Note   CSN: [id number] Arrival date & time: 03/15/23  2121     History  Chief Complaint  Patient presents with   Abdominal Pain    Gina Gray is a 37 y.o. female.  Patient is a 37 year old female with a history of alcohol use disorder, hypertension and prior pancreatitis who presents with abdominal pain associate with nausea and vomiting.  She says it started about a week ago.  She has pain across the top of her abdomen.  She is also had some ongoing nausea and vomiting.  She denies any hematemesis.  No melena or blood in her stool.  She has had some loose stools but no other changes.  No fevers.  She is continuing to drink alcohol.  Her last intake was yesterday morning.  On chart view, she was last admitted in 2023 with severe necrotizing pancreatitis.       Home Medications Prior to Admission medications   Medication Sig Start Date End Date Taking? Authorizing Provider  Buprenorphine HCl-Naloxone HCl 8-2 MG FILM Place 1 Film under the tongue in the morning and at bedtime.   Yes [provider]  carvedilol (COREG) 12.5 MG tablet Take 12.5 mg by mouth 2 (two) times daily with a meal. 10/08/21  Yes [provider]  lisinopril (ZESTRIL) 20 MG tablet Take 20 mg by mouth daily. 10/09/21  Yes [provider]  sertraline (ZOLOFT) 100 MG tablet Take 150 mg by mouth daily.   Yes [provider]      Allergies    Zosyn [piperacillin sod-tazobactam so]    Review of Systems   Review of Systems  Constitutional:  Negative for chills, diaphoresis, fatigue and fever.  HENT:  Negative for congestion, rhinorrhea and sneezing.   Eyes: Negative.   Respiratory:  Negative for cough, chest tightness and shortness of breath.   Cardiovascular:  Negative for chest pain and leg swelling.  Gastrointestinal:  Positive for abdominal pain, nausea and vomiting. Negative for blood in stool and diarrhea.   Genitourinary:  Negative for difficulty urinating, flank pain, frequency and hematuria.  Musculoskeletal:  Negative for arthralgias and back pain.  Skin:  Negative for rash.  Neurological:  Negative for dizziness, speech difficulty, weakness, numbness and headaches.    Physical Exam Updated Vital Signs BP (!) 180/114 (BP Location: Right Arm)   Pulse 80   Temp 97.6 F (36.4 C) (Oral)   Resp (!) 22   Ht 5\' 8"  (1.727 m)   Wt 131.3 kg   SpO2 100%   BMI 44.01 kg/m  Physical Exam Constitutional:      Appearance: She is well-developed.  HENT:     Head: Normocephalic and atraumatic.  Eyes:     Pupils: Pupils are equal, round, and reactive to light.  Cardiovascular:     Rate and Rhythm: Normal rate and regular rhythm.     Heart sounds: Normal heart sounds.  Pulmonary:     Effort: Pulmonary effort is normal. No respiratory distress.     Breath sounds: Normal breath sounds. No wheezing or rales.  Chest:     Chest wall: No tenderness.  Abdominal:     General: Bowel sounds are normal.     Palpations: Abdomen is soft.     Tenderness: There is abdominal tenderness in the epigastric area. There is no guarding or rebound.  Musculoskeletal:        General: Normal range of motion.  Cervical back: Normal range of motion and neck supple.  Lymphadenopathy:     Cervical: No cervical adenopathy.  Skin:    General: Skin is warm and dry.     Findings: No rash.  Neurological:     Mental Status: She is alert and oriented to person, place, and time.     ED Results / Procedures / Treatments   Labs (all labs ordered are listed, but only abnormal results are displayed) Labs Reviewed  LIPASE, BLOOD - Abnormal; Notable for the following components:      Result Value   Lipase 57 (*)    All other components within normal limits  COMPREHENSIVE METABOLIC PANEL - Abnormal; Notable for the following components:   Sodium 132 (*)    CO2 21 (*)    Glucose, Bld 124 (*)    AST 132 (*)    ALT  81 (*)    Alkaline Phosphatase 128 (*)    Total Bilirubin 1.2 (*)    All other components within normal limits  CBC - Abnormal; Notable for the following components:   Hemoglobin 15.7 (*)    All other components within normal limits  URINALYSIS, ROUTINE W REFLEX MICROSCOPIC - Abnormal; Notable for the following components:   Color, Urine AMBER (*)    APPearance HAZY (*)    Hgb urine dipstick SMALL (*)    Protein, ur 100 (*)    Bacteria, UA RARE (*)    All other components within normal limits  HCG, SERUM, QUALITATIVE  ETHANOL    EKG None  Radiology CT ABDOMEN PELVIS W CONTRAST Result Date: 03/16/2023 CLINICAL DATA:  Abdominal pain EXAM: CT ABDOMEN AND PELVIS WITH CONTRAST TECHNIQUE: Multidetector CT imaging of the abdomen and pelvis was performed using the standard protocol following bolus administration of intravenous contrast. RADIATION DOSE REDUCTION: This exam was performed according to the departmental dose-optimization program which includes automated exposure control, adjustment of the mA and/or kV according to patient size and/or use of iterative reconstruction technique. CONTRAST:  75mL OMNIPAQUE IOHEXOL 350 MG/ML SOLN COMPARISON:  10/30/2021 FINDINGS: Lower chest: No acute abnormality. Hepatobiliary: Fatty infiltration of the liver is noted. The gallbladder is within normal limits. Pancreas: Pancreas is well visualized. Very minimal peripancreatic inflammatory changes are noted surrounding the head and uncinate process. Phlegmon is noted. No pseudocyst is seen. Spleen: Normal in size without focal abnormality. Adrenals/Urinary Tract: Adrenal glands are within normal limits. Kidneys demonstrate a normal enhancement pattern bilaterally. No renal calculi or obstructive changes are seen. The bladder is partially distended. Stomach/Bowel: The appendix is within normal limits. No obstructive or inflammatory changes of the colon are seen. The stomach is within normal limits. Mild reactive  inflammatory changes of the second portion of the duodenum are seen. No other small bowel abnormality is noted. Vascular/Lymphatic: No significant vascular findings are present. No enlarged abdominal or pelvic lymph nodes. Reproductive: Uterus and bilateral adnexa are unremarkable. IUD is noted in place. Other: No abdominal wall hernia or abnormality. No abdominopelvic ascites. Musculoskeletal: No acute or significant osseous findings. IMPRESSION: Mild changes of pancreatitis in the head and uncinate process with some associated duodenal inflammation. Fatty liver. Electronically Signed   By: Alcide Clever M.D.   On: 03/16/2023 01:11    Procedures Procedures    Medications Ordered in ED Medications  ondansetron [facility]) injection 4 mg (4 mg Intravenous Given 03/16/23 0119)  oxyCODONE-acetaminophen (PERCOCET/ROXICET) 5-325 MG per tablet 2 tablet (2 tablets Oral Given 03/16/23 0119)  iohexol (OMNIPAQUE) 350 MG/ML injection  75 mL (75 mLs Intravenous Contrast Given 03/16/23 0050)  morphine (PF) 4 MG/ML injection 4 mg (4 mg Intravenous Given 03/16/23 0851)  ondansetron (ZOFRAN-ODT) disintegrating tablet 8 mg (8 mg Oral Given 03/16/23 0850)  lactated ringers bolus 1,000 mL (0 mLs Intravenous Stopped 03/16/23 1001)  pantoprazole (PROTONIX) injection 40 mg (40 mg Intravenous Given 03/16/23 0849)  HYDROmorphone (DILAUDID) injection 1 mg (1 mg Intravenous Given 03/16/23 1021)    ED Course/ Medical Decision Making/ A&P                                 Medical Decision Making Amount and/or Complexity of Data Reviewed Labs: ordered.  Risk Prescription drug management. Decision regarding hospitalization.   Patient is a 36 year old female who presents with upper abdominal pain.  Labs show a mildly elevated lipase.  She also has elevated LFTs.  On chart review, I do not see that her LFTs have been elevated in the past.  She does have sent alcohol use.  CT scan shows evidence of acute pancreatitis but no  concerning features.  No evidence of gallbladder disease.  Her pancreatitis is likely related to her alcohol use.  Other labs reviewed and are nonconcerning.  She was given IV fluids as well as antiemetics and pain medication in the ED.  Overall is feeling better but she feels like she needs to be admitted to the hospital for ongoing treatment of her pain and nausea.  Discussed with the family medicine service who will admit the patient.  Final Clinical Impression(s) / ED Diagnoses Final diagnoses:  Alcohol-induced acute pancreatitis without infection or necrosis  Transaminitis  Alcohol use disorder    Rx / DC Orders ED Discharge Orders     None         Rolan Bucco, MD 03/16/23 1227    Rolan Bucco, MD 03/16/23 1228

## 2023-03-16 NOTE — Assessment & Plan Note (Addendum)
Likely in the setting of alcohol use disorder but cannot rule out gallstone etiology. H/o necrotizing pancreatitis in 09/2021, required high-dose IV opioids during that time due to con commitment history of OUD on Suboxone and anticipate similar needs during this hospitalization.  Will treat pain aggressively, rehydrate and advance diet as tolerated. - Admit to MedSurg, Dr. Miquel Dunn attending - RUQ [place] to assess for gallstones - Lipid panel to rule out hypertriglyceridemia origin - NPO at this time; LR maintenance IV fluids (200 mL/h) - Sch Tylenol IV 1000mg  every 6 hours - IV Dilaudid every 4 hours prn, may consider pain regimen adjustment given home use of Suboxone 8-2 BID - Intend to add p.o. oxycodone 5 mg once patient can tolerate oral medications - IV Zofran every 6 hours as needed - AM CBC, CMP, lipid panel, magnesium

## 2023-03-17 DIAGNOSIS — F1090 Alcohol use, unspecified, uncomplicated: Secondary | ICD-10-CM | POA: Diagnosis not present

## 2023-03-17 DIAGNOSIS — E66813 Obesity, class 3: Secondary | ICD-10-CM | POA: Diagnosis present

## 2023-03-17 DIAGNOSIS — F419 Anxiety disorder, unspecified: Secondary | ICD-10-CM | POA: Diagnosis present

## 2023-03-17 DIAGNOSIS — K859 Acute pancreatitis without necrosis or infection, unspecified: Secondary | ICD-10-CM | POA: Diagnosis present

## 2023-03-17 DIAGNOSIS — F329 Major depressive disorder, single episode, unspecified: Secondary | ICD-10-CM | POA: Diagnosis present

## 2023-03-17 DIAGNOSIS — Z79899 Other long term (current) drug therapy: Secondary | ICD-10-CM | POA: Diagnosis not present

## 2023-03-17 DIAGNOSIS — E871 Hypo-osmolality and hyponatremia: Secondary | ICD-10-CM | POA: Diagnosis present

## 2023-03-17 DIAGNOSIS — Z6841 Body Mass Index (BMI) 40.0 and over, adult: Secondary | ICD-10-CM | POA: Diagnosis not present

## 2023-03-17 DIAGNOSIS — E7800 Pure hypercholesterolemia, unspecified: Secondary | ICD-10-CM | POA: Diagnosis present

## 2023-03-17 DIAGNOSIS — Z881 Allergy status to other antibiotic agents status: Secondary | ICD-10-CM | POA: Diagnosis not present

## 2023-03-17 DIAGNOSIS — M7989 Other specified soft tissue disorders: Secondary | ICD-10-CM | POA: Diagnosis not present

## 2023-03-17 DIAGNOSIS — R0902 Hypoxemia: Secondary | ICD-10-CM | POA: Diagnosis present

## 2023-03-17 DIAGNOSIS — F1120 Opioid dependence, uncomplicated: Secondary | ICD-10-CM | POA: Diagnosis present

## 2023-03-17 DIAGNOSIS — R7401 Elevation of levels of liver transaminase levels: Secondary | ICD-10-CM | POA: Diagnosis not present

## 2023-03-17 DIAGNOSIS — F10139 Alcohol abuse with withdrawal, unspecified: Secondary | ICD-10-CM | POA: Diagnosis present

## 2023-03-17 DIAGNOSIS — E039 Hypothyroidism, unspecified: Secondary | ICD-10-CM | POA: Diagnosis present

## 2023-03-17 DIAGNOSIS — I10 Essential (primary) hypertension: Secondary | ICD-10-CM | POA: Diagnosis present

## 2023-03-17 DIAGNOSIS — K852 Alcohol induced acute pancreatitis without necrosis or infection: Secondary | ICD-10-CM | POA: Diagnosis present

## 2023-03-17 DIAGNOSIS — E781 Pure hyperglyceridemia: Secondary | ICD-10-CM

## 2023-03-17 DIAGNOSIS — K760 Fatty (change of) liver, not elsewhere classified: Secondary | ICD-10-CM | POA: Diagnosis present

## 2023-03-17 LAB — COMPREHENSIVE METABOLIC PANEL
ALT: 61 U/L — ABNORMAL HIGH (ref 0–44)
AST: 90 U/L — ABNORMAL HIGH (ref 15–41)
Albumin: 3.2 g/dL — ABNORMAL LOW (ref 3.5–5.0)
Alkaline Phosphatase: 105 U/L (ref 38–126)
Anion gap: 9 (ref 5–15)
BUN: 8 mg/dL (ref 6–20)
CO2: 24 mmol/L (ref 22–32)
Calcium: 8.8 mg/dL — ABNORMAL LOW (ref 8.9–10.3)
Chloride: 102 mmol/L (ref 98–111)
Creatinine, Ser: 0.7 mg/dL (ref 0.44–1.00)
GFR, Estimated: >60 mL/min (ref >60)
Glucose, Bld: 112 mg/dL — ABNORMAL HIGH (ref 70–99)
Potassium: 3.7 mmol/L (ref 3.5–5.1)
Sodium: 135 mmol/L (ref 135–145)
Total Bilirubin: 0.6 mg/dL (ref 0.0–1.2)
Total Protein: 6.6 g/dL (ref 6.5–8.1)

## 2023-03-17 LAB — CBC
HCT: 41 % (ref 36.0–46.0)
Hemoglobin: 14.2 g/dL (ref 12.0–15.0)
MCH: 31.8 pg (ref 26.0–34.0)
MCHC: 34.6 g/dL (ref 30.0–36.0)
MCV: 91.7 fL (ref 80.0–100.0)
Platelets: 168 10*3/uL (ref 150–400)
RBC: 4.47 MIL/uL (ref 3.87–5.11)
RDW: 12.5 % (ref 11.5–15.5)
WBC: 4.1 10*3/uL (ref 4.0–10.5)
nRBC: 0 % (ref 0.0–0.2)

## 2023-03-17 LAB — LIPID PANEL
Cholesterol: 245 mg/dL — ABNORMAL HIGH (ref 0–200)
HDL: 32 mg/dL — ABNORMAL LOW (ref >40)
LDL Cholesterol: UNDETERMINED mg/dL (ref 0–99)
Total CHOL/HDL Ratio: 7.7 {ratio}
Triglycerides: 673 mg/dL — ABNORMAL HIGH (ref <150)
VLDL: UNDETERMINED mg/dL (ref 0–40)

## 2023-03-17 LAB — MAGNESIUM: Magnesium: 1.8 mg/dL (ref 1.7–2.4)

## 2023-03-17 LAB — LACTIC ACID, PLASMA
Lactic Acid, Venous: 1 mmol/L (ref 0.5–1.9)
Lactic Acid, Venous: 1 mmol/L (ref 0.5–1.9)

## 2023-03-17 LAB — LDL CHOLESTEROL, DIRECT: Direct LDL: 137 mg/dL — ABNORMAL HIGH (ref 0–99)

## 2023-03-17 MED ORDER — CARVEDILOL 12.5 MG PO TABS
12.5000 mg | ORAL_TABLET | Freq: Two times a day (BID) | ORAL | Status: DC
  Administered 2023-03-17 – 2023-03-19 (×5): 12.5 mg via ORAL
  Filled 2023-03-17 (×5): qty 1

## 2023-03-17 MED ORDER — LORAZEPAM 1 MG PO TABS
1.0000 mg | ORAL_TABLET | ORAL | Status: DC | PRN
  Administered 2023-03-17 – 2023-03-20 (×7): 1 mg via ORAL
  Filled 2023-03-17 (×7): qty 1

## 2023-03-17 MED ORDER — LISINOPRIL 20 MG PO TABS
20.0000 mg | ORAL_TABLET | Freq: Every day | ORAL | Status: DC
  Administered 2023-03-17 – 2023-03-19 (×3): 20 mg via ORAL
  Filled 2023-03-17 (×3): qty 1

## 2023-03-17 MED ORDER — SERTRALINE HCL 100 MG PO TABS
150.0000 mg | ORAL_TABLET | Freq: Every day | ORAL | Status: DC
  Administered 2023-03-17 – 2023-03-23 (×7): 150 mg via ORAL
  Filled 2023-03-17 (×7): qty 2

## 2023-03-17 MED ORDER — LORAZEPAM 2 MG/ML IJ SOLN
1.0000 mg | INTRAMUSCULAR | Status: DC | PRN
  Administered 2023-03-18: 1 mg via INTRAVENOUS
  Filled 2023-03-17: qty 1

## 2023-03-17 MED ORDER — ENOXAPARIN SODIUM 60 MG/0.6ML IJ SOSY
60.0000 mg | PREFILLED_SYRINGE | INTRAMUSCULAR | Status: DC
  Administered 2023-03-18 – 2023-03-22 (×5): 60 mg via SUBCUTANEOUS
  Filled 2023-03-17 (×5): qty 0.6

## 2023-03-17 MED ORDER — HYDROMORPHONE HCL 1 MG/ML IJ SOLN
0.5000 mg | INTRAMUSCULAR | Status: DC | PRN
  Administered 2023-03-17 – 2023-03-19 (×11): 1 mg via INTRAVENOUS
  Filled 2023-03-17 (×11): qty 1

## 2023-03-17 NOTE — Assessment & Plan Note (Addendum)
 Drinking fifth of liquor daily x 5 months.  Her last drink was 12/29 at 10 AM (slightly more than 24 hours prior to admission).  Denies h/o withdrawal seizures and Dts. She does desire to stop drinking completely. CIWAS elevated overnight ~5, Ativan  added overnight, has received 2 mg in total this morning.  - CIWA every 4 hours w/ Ativan  orders - Continue folic acid , multivitamin, thiamine  supplementation when patient is able to tolerate PO - Consult TOC for substance use counseling

## 2023-03-17 NOTE — Assessment & Plan Note (Addendum)
 AST and ALT elevated, will CTM -AM CMP

## 2023-03-17 NOTE — Assessment & Plan Note (Addendum)
 Initially elevated likely in the setting of pain.  Will restart meds as tolerating PO -Lisinopril 20 mg daily, Coreg 12.5 mg daily

## 2023-03-17 NOTE — Progress Notes (Signed)
     Daily Progress Note Intern Pager: [phone]  Patient name: Gina Gray Medical record number: [id number] Date of birth: 07-01-1985 Age: 37 y.o. Gender: female  Primary Care Provider: Silvano Angeline FALCON, NP Consultants: None Code Status: Full Code  Pt Overview and Major Events to Date:  12/30 - admitted  Assessment and Plan:  Gina Gray is a 37 yo woman w/ a PMH of AUD, OUD on Suboxone , and HTN, admitted for pancreatitis and EtOH withdrawl  Assessment & Plan Pancreatitis Likely in the setting of alcohol use disorder but elevated triglycerides may be playing a role. Will treat pain aggressively. Triglycerides elevated to 673 on lipid panel, likely playing a role, will consult GI. Pain well controlled this morning, will attempt to advance to clear liquid diet.  - Consult GI about triglycerides  - Advance to clear liquid diet; LR maintenance IV fluids (200 mL/h) - Sch Tylenol  IV 1000mg  every 6 hours - IV Dilaudid  every 4 hours prn, may consider pain regimen adjustment given home use of Suboxone  8-2 BID - Suboxone  2-0.5 q6h  - IV Zofran  every 6 hours as needed - AM CBC, CMP, magnesium Alcohol use disorder Drinking fifth of liquor daily x 5 months.  Her last drink was 12/29 at 10 AM (slightly more than 24 hours prior to admission).  Denies h/o withdrawal seizures and Dts. She does desire to stop drinking completely. CIWAS elevated overnight ~5, Ativan  added overnight, has received 2 mg in total this morning.  - CIWA every 4 hours w/ Ativan  orders - Continue folic acid , multivitamin, thiamine  supplementation when patient is able to tolerate PO - Consult TOC for substance use counseling HTN (hypertension) Initially elevated likely in the setting of pain.  Will restart meds as tolerating PO -Lisinopril  20 mg daily, Coreg  12.5 mg daily  Transaminitis AST and ALT elevated, will CTM -AM CMP   Chronic and Stable Issues: Opioid dependence: Pain regimen as above MDD: Hold home  sertraline  150 mg, restart when able  FEN/GI: NPO PPx: Lovenox  Dispo:Home pending clinical improvement .   Subjective:  Pain well controlled today, but feeling anxious and sweaty. Reports last drink was Sunday morning.   Objective: Temp:  [97.6 F (36.4 C)-98.3 F (36.8 C)] 97.7 F (36.5 C) (12/31 0348) Pulse Rate:  [67-91] 91 (12/31 0500) Resp:  [18-22] 20 (12/31 0348) BP: (124-180)/(72-114) 148/92 (12/31 0500) SpO2:  [95 %-100 %] 99 % (12/31 0348) Physical Exam: General: NAD, conversant, uncomfortable Cardiovascular: RRR, NRMG, S1/S2 Respiratory: CTABL Abdomen: Soft, mildly TTP in lower quadrants, non-distended Extremities: Moving all extremities, no edema  Laboratory: Most recent CBC Lab Results  Component Value Date   WBC 6.7 03/16/2023   HGB 14.6 03/16/2023   HCT 42.0 03/16/2023   MCV 90.5 03/16/2023   PLT 199 03/16/2023   Most recent BMP    Latest Ref Rng & Units 03/16/2023    2:28 PM  BMP  Creatinine 0.44 - 1.00 mg/dL 9.33      Gina Riis, MD 03/17/2023, 6:56 AM  PGY-3, [facility] FPTS Intern pager: [phone], text pages welcome Secure chat group [facility] Teaching Service

## 2023-03-17 NOTE — Consult Note (Signed)
 Consultation Note   Referring Provider:  Family Medicine Teaching Service Primary Gastroenterologist: Sampson.       Reason for Consultation: Pancreatitis  DOA: 03/15/2023         Hospital Day: 3   ASSESSMENT    Brief Narrative:  37 y.o. 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 Probably 2/2 to Etoh but hypertriglyceridemia possibly contributing factor.  Less likely cause is Zestril . No gallstones on US .  Still uncomfortable with abdominal pain but about to try clear liquids for first time this admission. Her WBC is normal, HCT 41%. Renal function normal.   Alcohol use disorder  Elevated liver enzymes AST > ALT, likely Etoh and steatosis related.   OUD on Suboxone   Principal Problem:   Pancreatitis Active Problems:   HTN (hypertension)   Alcohol use disorder   Transaminitis   PLAN:   --Needs treatment of hypertriglyceridemia. Could resume Fenofibrate  145 mg daily. --Will see how she does with clear liquids and off IV pain meds ( discontinued earlier today)   --Etoh abstinence is paramount. She wants and intends to quit.   HPI   Gina Gray had a very prolonged hospitalization ([facility]) in July 2023 for severe acute pancreatitis with possible necrosis and pseudocyst and also splenic vein thrombosis.  Etiology felt to be secondary to alcohol. She was not started on anticoagulation due to risk for hemorrhagic pancreatitis.  She has a history of hypertriglyceridemia and was prescribed treatment at the time of hospital discharge.  However at some point patient did not seek refills for the medication as she was unaware that it was necessary to keep taking it.  Gina Gray had stopped  drinking after hospitalization until about 5 months. Since then has been drinking about a pint of liquor every day.   Patient presented to the ED 2 days ago with abdominal pain radiating through to her back.  Lipase mildly elevated.  CT scan shows mild pancreatitis. Pancreatitis presumed to be alcohol related.  However she has a history of hypertriglyceridemia and her triglycerides were 673. No FMH of pancreatic diseases. Non-smoker. Takes an ACE inhibitor at home.   Notable labs / Imaging / Events this admission  :  Total bilirubin 1.2, AST 132, ALT 81, alk phos 128 Triglycerides 673 CBC normal Lipase 57  CTAP with contrast -Mild changes of pancreatitis in the head and uncinate process with some associated duodenal inflammation.  RUQ ultrasound - no gallstones.  CBD 4 to 5 mm  Interval History:  Transaminases improving, bilirubin and alkaline phosphatase have normalized. She is still having discomfort in RUQ / back and chest.  Not really having LUQ pain. About to try clear liquids. No chronic GI complaints.    Labs and Imaging: Recent Labs    03/15/23 2136 03/16/23 1428 03/17/23 1127  WBC 8.2 6.7 4.1  HGB 15.7* 14.6 14.2  HCT 44.1 42.0 41.0  PLT 228 199 168   Recent Labs    03/15/23 2136 03/16/23 1428 03/17/23 0632  NA 132*  --  135  K 3.7  --  3.7  CL 101  --  102  CO2 21*  --  24  GLUCOSE 124*  --  112*  BUN 9  --  8  CREATININE 0.70 0.66 0.70  CALCIUM 9.2  --  8.8*   Recent Labs    03/17/23 0632  PROT 6.6  ALBUMIN 3.2*  AST 90*  ALT 61*  ALKPHOS 105  BILITOT 0.6   No results for input(s): HEPBSAG, HCVAB, HEPAIGM, HEPBIGM in the last 72 hours. No results for input(s): LABPROT, INR in the last 72 hours.    Past Medical History:  Diagnosis Date   Abdominal pain 10/15/2021   Acute respiratory failure with hypoxia (HCC) 08/13/2021   AKI (acute kidney injury) (HCC) 08/13/2021   Alcohol abuse 08/13/2021   Alcohol withdrawal syndrome without  complication (HCC) 08/13/2021   Anxiety disorder    Depression 10/15/2021   Essential (primary) hypertension    History of alcohol use disorder 08/13/2021   Hypertriglyceridemia 08/13/2021   Hypocalcemia 08/13/2021   Hypokalemia 08/13/2021   Hypomagnesemia 08/13/2021   Hyponatremia 08/13/2021   Hypophosphatemia 08/13/2021   Hypothyroidism    Hypovolemia 08/13/2021   Lactic acidosis 08/13/2021   Left upper quadrant abdominal pain 10/15/2021   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   Opioid abuse (HCC) 08/13/2021   Pancreatic pseudocyst 10/15/2021   Pancreatitis, unspecified pancreatitis type 08/13/2021   Portal vein thrombosis 10/15/2021   Primary hypertension 08/13/2021   Sepsis (HCC) 08/13/2021   SVT (supraventricular tachycardia) (HCC) 08/13/2021    Past Surgical History:  Procedure Laterality Date   CONJUNCTIVA BIOPSY      Family History  Problem Relation Age of Onset   Heart disease Mother    Heart disease Father    Hypertension Brother    Breast cancer Paternal Grandmother     Prior to Admission medications   Medication Sig Start Date End Date Taking? Authorizing Provider  Buprenorphine  HCl-Naloxone  HCl 8-2 MG FILM Place 1 Film under the tongue in the morning and at bedtime.   Yes [provider]  carvedilol  (COREG ) 12.5 MG tablet Take 12.5 mg by mouth 2 (two) times daily with a meal. 10/08/21  Yes [provider]  lisinopril  (ZESTRIL ) 20 MG tablet Take 20 mg by mouth daily. 10/09/21  Yes [provider]  sertraline  (ZOLOFT ) 100 MG tablet Take 150 mg by mouth daily.   Yes [provider]    Current Facility-Administered Medications  Medication Dose Route Frequency Provider Last Rate Last Admin   buprenorphine -naloxone  (SUBOXONE ) 2-0.5 mg per SL tablet 2 tablet  2 tablet Sublingual Q6H Lafe Domino, DO   2 tablet at 03/17/23 1132   carvedilol  (COREG ) tablet 12.5 mg  12.5 mg Oral BID WC Sowell, Brandon, MD       enoxaparin  (LOVENOX )  injection 40 mg  40 mg Subcutaneous Q24H Spence, Sarah, DO   40 mg at 03/16/23 1727   folic acid  (FOLVITE ) tablet 1 mg  1 mg Oral Daily Lafe Domino, DO   1 mg at 03/17/23 9061   HYDROmorphone  (DILAUDID ) injection 0.5-1 mg  0.5-1 mg Intravenous Q3H PRN Jennelle Riis, MD   1 mg at 03/17/23 1345   lactated ringers  infusion   Intravenous Continuous Lafe Domino, DO 200 mL/hr at 03/17/23 9096 New Bag at 03/17/23 0903   lisinopril  (ZESTRIL ) tablet 20 mg  20 mg Oral Daily Sowell, Brandon, MD   20 mg at 03/17/23 1313   LORazepam  (ATIVAN ) tablet 1-4 mg  1-4 mg Oral Q1H PRN Christia Budds, MD  1 mg at 03/17/23 1313   Or   LORazepam  (ATIVAN ) injection 1-4 mg  1-4 mg Intravenous Q1H PRN Christia Budds, MD       melatonin tablet 3 mg  3 mg Oral QHS PRN Gomes, Adriana, DO       multivitamin with minerals tablet 1 tablet  1 tablet Oral Daily Lafe Domino, DO   1 tablet at 03/17/23 1131   ondansetron  (ZOFRAN ) tablet 4 mg  4 mg Oral Q6H PRN Lafe Domino, DO       Or   ondansetron  (ZOFRAN ) injection 4 mg  4 mg Intravenous Q6H PRN Lafe Domino, DO   4 mg at 03/16/23 2044   sertraline  (ZOLOFT ) tablet 150 mg  150 mg Oral Daily Christia Budds, MD   150 mg at 03/17/23 9061   thiamine  (VITAMIN B1) tablet 100 mg  100 mg Oral Daily Lafe Domino, DO   100 mg at 03/17/23 9061    Allergies as of 03/15/2023 - Review Complete 03/15/2023  Allergen Reaction Noted   Zosyn [piperacillin sod-tazobactam so] Other (See Comments) 06/10/2022    Social History   Socioeconomic History   Marital status: Single    Spouse name: Not on file   Number of children: Not on file   Years of education: Not on file   Highest education level: Not on file  Occupational History   Not on file  Tobacco Use   Smoking status: Never   Smokeless tobacco: Never  Substance and Sexual Activity   Alcohol use: No   Drug use: No   Sexual activity: Yes  Other Topics Concern   Not on file  Social History Narrative   Not on file    Social Drivers of Health   Financial Resource Strain: Low Risk  (10/15/2021)   Received from [facility], [facility]   Overall Financial Resource Strain (CARDIA)    Difficulty of Paying Living Expenses: Not hard at all  Food Insecurity: No Food Insecurity (03/16/2023)   Hunger Vital Sign    Worried About Running Out of Food in the Last Year: Never true    Ran Out of Food in the Last Year: Never true  Transportation Needs: No Transportation Needs (03/16/2023)   PRAPARE - Administrator, Civil Service (Medical): No    Lack of Transportation (Non-Medical): No  Physical Activity: Not on file  Stress: No Stress Concern Present (10/15/2021)   Received from [facility], [facility] of Occupational Health - Occupational Stress Questionnaire    Feeling of Stress : Not at all  Social Connections: Unknown (08/13/2021)   Received from [facility], [facility]   Social Network    Social Network: Not on file  Intimate Partner Violence: Not At Risk (03/16/2023)   Humiliation, Afraid, Rape, and Kick questionnaire    Fear of Current or Ex-Partner: No    Emotionally Abused: No    Physically Abused: No    Sexually Abused: No     Code Status   Code Status: Full Code  Review of Systems: All systems reviewed and negative except where noted in HPI.  Physical Exam: Vital signs in last 24 hours: Temp:  [97.7 F (36.5 C)-98.2 F (36.8 C)] 98.1 F (36.7 C) (12/31 0749) Pulse Rate:  [67-91] 79 (12/31 1243) Resp:  [18-20] 19 (12/31 0749) BP: (124-150)/(72-100) 142/86 (12/31 1243) SpO2:  [95 %-99 %] 97 % (12/31 0749) Last BM Date : 03/16/23  General:  Pleasant female in  NAD Psych:  Cooperative. Normal mood and affect Eyes: Pupils equal Ears:  Normal auditory acuity Nose: No deformity, discharge or lesions Neck:  Supple, no masses felt Lungs:  Clear to auscultation.  Heart:  Regular rate, regular rhythm.  Abdomen:  Soft, nondistended, mild  epigastric tenderness, active bowel sounds, no masses felt Rectal :  Deferred Msk: Symmetrical without gross deformities.  Neurologic:  Alert, oriented, grossly normal neurologically Extremities : No edema Skin:  Intact without significant lesions.    Intake/Output from previous day: 12/30 0701 - 12/31 0700 In: 2540.5 [I.V.:2341; IV Piggyback:199.5] Out: 750 [Urine:750] Intake/Output this shift:  Total I/O In: 329.9 [I.V.:329.9] Out: -    Vina Dasen, NP-C   03/17/2023, 1:46 PM

## 2023-03-17 NOTE — Plan of Care (Signed)
 FMTS Brief Progress Note  Spoke with NP on call for [facility], Vina Belts about patient being admitted for pancreatitis, w/ Hx of EtOH abuse. Discussed we thought pancreatitis was 2/2 EtOH, but her Triglycerides returned in the 600's. Asked GI to weigh in on need to treat Triglycerides for pancreatitis. Ms. Belts noted they would see the patient.   Jennelle Riis, MD 03/17/2023, 1:26 PM PGY-3, [facility] Night Resident  Please page [phone] with questions.

## 2023-03-17 NOTE — Plan of Care (Signed)

## 2023-03-17 NOTE — Plan of Care (Signed)
   Problem: Activity: Goal: Risk for activity intolerance will decrease Outcome: Progressing   Problem: Nutrition: Goal: Adequate nutrition will be maintained Outcome: Progressing   Problem: Coping: Goal: Level of anxiety will decrease Outcome: Progressing

## 2023-03-17 NOTE — Assessment & Plan Note (Addendum)
 Likely in the setting of alcohol use disorder but elevated triglycerides may be playing a role. Will treat pain aggressively. Triglycerides elevated to 673 on lipid panel, likely playing a role, will consult GI. Pain well controlled this morning, will attempt to advance to clear liquid diet.  - Consult GI about triglycerides  - Advance to clear liquid diet; LR maintenance IV fluids (200 mL/h) - Sch Tylenol  IV 1000mg  every 6 hours - IV Dilaudid  every 4 hours prn, may consider pain regimen adjustment given home use of Suboxone  8-2 BID - Suboxone  2-0.5 q6h  - IV Zofran  every 6 hours as needed - AM CBC, CMP, magnesium

## 2023-03-18 DIAGNOSIS — K859 Acute pancreatitis without necrosis or infection, unspecified: Secondary | ICD-10-CM | POA: Diagnosis not present

## 2023-03-18 LAB — COMPREHENSIVE METABOLIC PANEL
ALT: 64 U/L — ABNORMAL HIGH (ref 0–44)
AST: 100 U/L — ABNORMAL HIGH (ref 15–41)
Albumin: 3.2 g/dL — ABNORMAL LOW (ref 3.5–5.0)
Alkaline Phosphatase: 107 U/L (ref 38–126)
Anion gap: 10 (ref 5–15)
BUN: 6 mg/dL (ref 6–20)
CO2: 24 mmol/L (ref 22–32)
Calcium: 9 mg/dL (ref 8.9–10.3)
Chloride: 102 mmol/L (ref 98–111)
Creatinine, Ser: 0.62 mg/dL (ref 0.44–1.00)
GFR, Estimated: >60 mL/min (ref >60)
Glucose, Bld: 96 mg/dL (ref 70–99)
Potassium: 3.7 mmol/L (ref 3.5–5.1)
Sodium: 136 mmol/L (ref 135–145)
Total Bilirubin: 1 mg/dL (ref 0.0–1.2)
Total Protein: 7 g/dL (ref 6.5–8.1)

## 2023-03-18 MED ORDER — FENOFIBRATE 160 MG PO TABS
160.0000 mg | ORAL_TABLET | Freq: Every day | ORAL | Status: DC
  Administered 2023-03-18 – 2023-03-23 (×6): 160 mg via ORAL
  Filled 2023-03-18 (×6): qty 1

## 2023-03-18 MED ORDER — ACETAMINOPHEN 500 MG PO TABS
1000.0000 mg | ORAL_TABLET | Freq: Four times a day (QID) | ORAL | Status: DC
  Administered 2023-03-18 – 2023-03-22 (×18): 1000 mg via ORAL
  Filled 2023-03-18 (×19): qty 2

## 2023-03-18 MED ORDER — LACTATED RINGERS IV SOLN: INTRAVENOUS | Status: AC

## 2023-03-18 MED ORDER — HYDROXYZINE HCL 10 MG PO TABS
10.0000 mg | ORAL_TABLET | Freq: Three times a day (TID) | ORAL | Status: DC | PRN
  Administered 2023-03-18 – 2023-03-22 (×7): 10 mg via ORAL
  Filled 2023-03-18 (×7): qty 1

## 2023-03-18 NOTE — Assessment & Plan Note (Addendum)
 Last drink was 12/29 at 10 AM.  Received Ativan  1 mg overnight for elevated CIWA's.  Underlying anxiety possibly worsening withdrawal, will start hydroxyzine  as needed for symptomatic management. - CIWA every 4 hours w/ prn Ativan  orders - Hydroxyzine  as needed for anxiety - Continue folic acid , multivitamin, thiamine  supplementation when patient is able to tolerate PO

## 2023-03-18 NOTE — Assessment & Plan Note (Addendum)
 Some worsening pain on clear liquids, will trial additional intake this a.m. but may need to transition back to n.p.o. with fluids if worsening pain.  Will continue current pain regimen, will likely need slow taper and IV pain medication given history of severe pancreatitis. Seen by GI, recommended restarting fenofibrate . - Continue clear liquid diet, may need to transition back to n.p.o. with fluids if continues to have worsening pain - Pain regimen: Sch Tylenol  PO 1000mg  q6h, IV Dilaudid  q3h prn - Suboxone  2-0.5 q6h  - Fenofibrate  for hypertriglyceridemia - IV Zofran  every 6 hours as needed

## 2023-03-18 NOTE — Plan of Care (Signed)

## 2023-03-18 NOTE — Assessment & Plan Note (Signed)
 Trending down, will continue to monitor.

## 2023-03-18 NOTE — Hospital Course (Addendum)
 Gina Gray is a 38 y.o.female with a history of OUD on suboxone , alcohol use disorder, history of severe necrotizing pancreatitis in 2023 who was admitted to the family medicine teaching Service at [facility] for epigastric/RUQ abdominal pain in the setting of pancreatitis. Her hospital course is detailed below:  Pancreatitis Likely in the setting of alcohol use disorder as patient reports drinking 1/5 of liquor daily x 5 months. Initial CT Abd suggestive of pancreatitis, RUQ US  without gallstones. Patient started on 1.5 MIVF and IV pain medication in addition to home Suboxone  (for OUD) for management. Triglycerides elevated >600, started on fenofibrate  per GI. Due persistent pain and Tmax 100.3 during admission, blood cultures were drawn and patient had repeat CT Abdomen which demonstrated improving pancreatitis. Thereafter, patient weaned from IVF and transitioned to tylenol  and suboxone  only for pain management. By the time of discharge, patient was tolerating PO regular diet well. Discharged on tylenol  and home suboxone . At time of discharge, blood cultures were NG 3 days, still pending final results.   AUD Daily heavy drinking as above. CIWA protocol used during admission, patient required Ativan  intermittently until 03/20/23. Patient received folic acid , multivitamin, thiamine  supplementation during admission.   Hypoxia  Intermittent desats to high 80s during admission. Remained on RA throughout admission. CXR with low volume chest, without pneumonia or edema. By time of discharge, patient remained stable on room air.   Other chronic conditions were medically managed with home medications and formulary alternatives as necessary (IUD, MDD, HTN)  PCP Follow-up Recommendations: Alcohol use cessation resources GI follow-up for recurrent pancreatitis Recommend sleep study outpatient Fu elevated cholesterol

## 2023-03-18 NOTE — Plan of Care (Signed)

## 2023-03-18 NOTE — Progress Notes (Signed)
 Daily Progress Note Intern Pager: [phone]  Patient name: Gina Gray Medical record number: [id number] Date of birth: [birth year] Age: 38 y.o. Gender: female  Primary Care Provider: Silvano Angeline FALCON, NP Consultants: GI Code Status: Full code  Pt Overview and Major Events to Date:  12/30: Admitted to FMTS  Assessment and Plan: Ms. Gina Gray is a 38 yo woman w/ a PMH of AUD, OUD on Suboxone , and HTN, admitted for pancreatitis likely in the setting of AUD. Assessment & Plan Pancreatitis Some worsening pain on clear liquids, will trial additional intake this a.m. but may need to transition back to n.p.o. with fluids if worsening pain.  Will continue current pain regimen, will likely need slow taper and IV pain medication given history of severe pancreatitis. Seen by GI, recommended restarting fenofibrate . - Continue clear liquid diet, may need to transition back to n.p.o. with fluids if continues to have worsening pain - Pain regimen: Sch Tylenol  PO 1000mg  q6h, IV Dilaudid  q3h prn - Suboxone  2-0.5 q6h  - Fenofibrate  for hypertriglyceridemia - IV Zofran  every 6 hours as needed Alcohol use disorder Last drink was 12/29 at 10 AM.  Received Ativan  1 mg overnight for elevated CIWA's.  Underlying anxiety possibly worsening withdrawal, will start hydroxyzine  as needed for symptomatic management. - CIWA every 4 hours w/ prn Ativan  orders - Hydroxyzine  as needed for anxiety - Continue folic acid , multivitamin, thiamine  supplementation when patient is able to tolerate PO Transaminitis Trending down, will continue to monitor   Chronic and Stable Issues: Opioid dependence: Pain regimen as above MDD: Continue Sertraline  150 mg HTN: Continue lisinopril  20 mg daily and Coreg  12.5 mg twice daily   FEN/GI: Clear liquid PPx: Lovenox  Dispo: Home pending clinical improvement  Subjective:  Assessed at bedside, reports worsening pain this a.m.  Possibly related to starting clears but wants to try  additional intake this morning before transitioning back to NPO.  Happy with current pain regimen.  Only 1 to 2 cups of water intake in the past 24 hours.  Recent BM yesterday  Reports some worsening anxiety that made it difficult to sleep overnight.  Additionally has some sweating and tremors likely secondary to alcohol withdrawal.  Objective: Temp:  [97.9 F (36.6 C)-98.6 F (37 C)] 97.9 F (36.6 C) (01/01 0802) Pulse Rate:  [65-112] 65 (01/01 0802) Resp:  [16-18] 16 (01/01 0802) BP: (117-160)/(71-101) 126/77 (01/01 0802) SpO2:  [92 %-97 %] 92 % (01/01 0802) Physical Exam: General: Sitting up in bed, flushed face.  NAD Cardiovascular: RRR without murmur Respiratory: CTAB.  Normal work of breathing on room air Abdomen: Soft, diffusely tender to palpation but worse in epigastric and right upper quadrant region.  Pain radiates to the back. Extremities: No peripheral edema  Laboratory: Most recent CBC Lab Results  Component Value Date   WBC 4.1 03/17/2023   HGB 14.2 03/17/2023   HCT 41.0 03/17/2023   MCV 91.7 03/17/2023   PLT 168 03/17/2023   Most recent BMP    Latest Ref Rng & Units 03/17/2023    6:32 AM  BMP  Glucose 70 - 99 mg/dL 887   BUN 6 - 20 mg/dL 8   Creatinine 9.55 - 8.99 mg/dL 9.29   Sodium 864 - 854 mmol/L 135   Potassium 3.5 - 5.1 mmol/L 3.7   Chloride 98 - 111 mmol/L 102   CO2 22 - 32 mmol/L 24   Calcium 8.9 - 10.3 mg/dL 8.8     Other pertinent labs: Total cholesterol:  245 HDL: 32 Triglycerides: 673 Lactate: 1.0  Imaging/Diagnostic Tests: None in the past 24 hours  Gina Pagan, MD 03/18/2023, 8:04 AM  PGY-2, [facility] FPTS Intern pager: [phone], text pages welcome Secure chat group [facility] Teaching Service

## 2023-03-19 DIAGNOSIS — K852 Alcohol induced acute pancreatitis without necrosis or infection: Secondary | ICD-10-CM | POA: Diagnosis not present

## 2023-03-19 LAB — COMPREHENSIVE METABOLIC PANEL
ALT: 67 U/L — ABNORMAL HIGH (ref 0–44)
AST: 92 U/L — ABNORMAL HIGH (ref 15–41)
Albumin: 3.2 g/dL — ABNORMAL LOW (ref 3.5–5.0)
Alkaline Phosphatase: 113 U/L (ref 38–126)
Anion gap: 8 (ref 5–15)
BUN: <5 mg/dL — ABNORMAL LOW (ref 6–20)
CO2: 25 mmol/L (ref 22–32)
Calcium: 9 mg/dL (ref 8.9–10.3)
Chloride: 103 mmol/L (ref 98–111)
Creatinine, Ser: 0.67 mg/dL (ref 0.44–1.00)
GFR, Estimated: >60 mL/min (ref >60)
Glucose, Bld: 133 mg/dL — ABNORMAL HIGH (ref 70–99)
Potassium: 3.8 mmol/L (ref 3.5–5.1)
Sodium: 136 mmol/L (ref 135–145)
Total Bilirubin: 1 mg/dL (ref 0.0–1.2)
Total Protein: 6.8 g/dL (ref 6.5–8.1)

## 2023-03-19 MED ORDER — HYDROMORPHONE HCL 1 MG/ML IJ SOLN
0.5000 mg | INTRAMUSCULAR | Status: DC | PRN
  Administered 2023-03-19 – 2023-03-21 (×14): 1 mg via INTRAVENOUS
  Filled 2023-03-19 (×15): qty 1

## 2023-03-19 NOTE — Progress Notes (Signed)
     Daily Progress Note Intern Pager: [phone]  Patient name: Gina Gray Medical record number: [id number] Date of birth: [birth year] Age: 38 y.o. Gender: female  Primary Care Provider: Silvano Angeline FALCON, NP Consultants: GI s/o Code Status: Full Code  Pt Overview and Major Events to Date:  12/30: Admitted to FMTS    Assessment and Plan:  Gina Gray is a 38 yo woman w/ a PMH of AUD, OUD on Suboxone , and HTN, admitted for pancreatitis likely in the setting of AUD. Assessment & Plan Pancreatitis Some worsening pain on clear liquids yesterday and was transitioned back to n.p.o. with fluids.  Report's pain not well controlled this am, as meds wear off after 2 hours. Will adjust current pain regimen, will likely need slow taper and IV pain medication given history of severe pancreatitis. Will continue NPO today. Restarted fenofibrate  for hypertriglyceridemia  - Pain regimen: Sch Tylenol  PO 1000mg  q6h, IV Dilaudid  q2h prn - Suboxone  2-0.5 q6h  - Fenofibrate  for hypertriglyceridemia - IV Zofran  every 6 hours as needed Alcohol use disorder Last drink was 12/29 at 10 AM.  Received Ativan  1 mg overnight for elevated CIWA's, hopefully nearing end of withdrawal course.  - CIWA every 4 hours w/ prn Ativan  orders - Hydroxyzine  as needed for anxiety - Continue folic acid , multivitamin, thiamine  supplementation when patient is able to tolerate PO Transaminitis Stable, will continue to monitor HTN (hypertension) Initially elevated likely in the setting of pain.  Will restart meds as tolerating PO. -Lisinopril  20 mg daily, Coreg  12.5 mg daily   Chronic and Stable Issues: Opioid dependence: Pain regimen as above MDD: Continue Sertraline  150 mg HTN: Continue lisinopril  20 mg daily and Coreg  12.5 mg twice daily  FEN/GI: NPO PPx: Lovenox  Dispo:Home pending clinical improvement . Barriers include pain control.   Subjective:  Pain poorly controlled this morning, and still having to struggle  with some anxiety and diaphoresis.   Objective: Temp:  [97.8 F (36.6 C)-98.5 F (36.9 C)] 98.1 F (36.7 C) (01/02 0817) Pulse Rate:  [62-85] 62 (01/02 0817) Resp:  [15-18] 16 (01/02 0817) BP: (121-156)/(78-103) 156/103 (01/02 0817) SpO2:  [94 %-97 %] 97 % (01/02 0817) Physical Exam: General: NAD, uncomfortable, conversant, good mood Cardiovascular: RRR, NRMG Respiratory: CTABL, normal WOB Abdomen: Soft, mildly TTP in RUQ/side, non-distended Extremities: Moving all extremities, no edema  Laboratory: Most recent CBC Lab Results  Component Value Date   WBC 4.1 03/17/2023   HGB 14.2 03/17/2023   HCT 41.0 03/17/2023   MCV 91.7 03/17/2023   PLT 168 03/17/2023   Most recent BMP    Latest Ref Rng & Units 03/18/2023    8:20 AM  BMP  Glucose 70 - 99 mg/dL 96   BUN 6 - 20 mg/dL 6   Creatinine 9.55 - 8.99 mg/dL 9.37   Sodium 864 - 854 mmol/L 136   Potassium 3.5 - 5.1 mmol/L 3.7   Chloride 98 - 111 mmol/L 102   CO2 22 - 32 mmol/L 24   Calcium 8.9 - 10.3 mg/dL 9.0      Gina Riis, MD 03/19/2023, 9:03 AM  PGY-3, [facility] FPTS Intern pager: [phone], text pages welcome Secure chat group [facility] Teaching Service

## 2023-03-19 NOTE — Plan of Care (Signed)

## 2023-03-19 NOTE — Assessment & Plan Note (Signed)
 Initially elevated likely in the setting of pain.  Will restart meds as tolerating PO -Lisinopril 20 mg daily, Coreg 12.5 mg daily

## 2023-03-19 NOTE — Assessment & Plan Note (Addendum)
 Stable, will continue to monitor.

## 2023-03-19 NOTE — Assessment & Plan Note (Addendum)
 Some worsening pain on clear liquids yesterday and was transitioned back to n.p.o. with fluids.  Report's pain not well controlled this am, as meds wear off after 2 hours. Will adjust current pain regimen, will likely need slow taper and IV pain medication given history of severe pancreatitis. Will continue NPO today. Restarted fenofibrate  for hypertriglyceridemia  - Pain regimen: Sch Tylenol  PO 1000mg  q6h, IV Dilaudid  q2h prn - Suboxone  2-0.5 q6h  - Fenofibrate  for hypertriglyceridemia - IV Zofran  every 6 hours as needed

## 2023-03-19 NOTE — Plan of Care (Signed)

## 2023-03-19 NOTE — Assessment & Plan Note (Signed)
 Last drink was 12/29 at 10 AM.  Received Ativan  1 mg overnight for elevated CIWA's, hopefully nearing end of withdrawal course.  - CIWA every 4 hours w/ prn Ativan  orders - Hydroxyzine  as needed for anxiety - Continue folic acid , multivitamin, thiamine  supplementation when patient is able to tolerate PO

## 2023-03-20 ENCOUNTER — Inpatient Hospital Stay (HOSPITAL_COMMUNITY): Payer: Medicaid Other

## 2023-03-20 DIAGNOSIS — F1090 Alcohol use, unspecified, uncomplicated: Secondary | ICD-10-CM | POA: Diagnosis not present

## 2023-03-20 DIAGNOSIS — R7401 Elevation of levels of liver transaminase levels: Secondary | ICD-10-CM

## 2023-03-20 DIAGNOSIS — K852 Alcohol induced acute pancreatitis without necrosis or infection: Secondary | ICD-10-CM | POA: Diagnosis not present

## 2023-03-20 LAB — CBC
HCT: 36.9 % (ref 36.0–46.0)
Hemoglobin: 13 g/dL (ref 12.0–15.0)
MCH: 32.2 pg (ref 26.0–34.0)
MCHC: 35.2 g/dL (ref 30.0–36.0)
MCV: 91.3 fL (ref 80.0–100.0)
Platelets: 163 10*3/uL (ref 150–400)
RBC: 4.04 MIL/uL (ref 3.87–5.11)
RDW: 12.5 % (ref 11.5–15.5)
WBC: 7.4 10*3/uL (ref 4.0–10.5)
nRBC: 0 % (ref 0.0–0.2)

## 2023-03-20 LAB — COMPREHENSIVE METABOLIC PANEL
ALT: 66 U/L — ABNORMAL HIGH (ref 0–44)
AST: 88 U/L — ABNORMAL HIGH (ref 15–41)
Albumin: 3.2 g/dL — ABNORMAL LOW (ref 3.5–5.0)
Alkaline Phosphatase: 114 U/L (ref 38–126)
Anion gap: 9 (ref 5–15)
BUN: <5 mg/dL — ABNORMAL LOW (ref 6–20)
CO2: 24 mmol/L (ref 22–32)
Calcium: 9.2 mg/dL (ref 8.9–10.3)
Chloride: 102 mmol/L (ref 98–111)
Creatinine, Ser: 0.73 mg/dL (ref 0.44–1.00)
GFR, Estimated: >60 mL/min (ref >60)
Glucose, Bld: 101 mg/dL — ABNORMAL HIGH (ref 70–99)
Potassium: 3.8 mmol/L (ref 3.5–5.1)
Sodium: 135 mmol/L (ref 135–145)
Total Bilirubin: 0.8 mg/dL (ref 0.0–1.2)
Total Protein: 6.9 g/dL (ref 6.5–8.1)

## 2023-03-20 LAB — TRIGLYCERIDES: Triglycerides: 265 mg/dL — ABNORMAL HIGH (ref <150)

## 2023-03-20 MED ORDER — LISINOPRIL 20 MG PO TABS
20.0000 mg | ORAL_TABLET | Freq: Every day | ORAL | Status: DC
  Administered 2023-03-20: 20 mg via ORAL
  Filled 2023-03-20: qty 1

## 2023-03-20 MED ORDER — PANTOPRAZOLE SODIUM 40 MG IV SOLR
40.0000 mg | Freq: Every day | INTRAVENOUS | Status: DC
  Administered 2023-03-20 – 2023-03-21 (×2): 40 mg via INTRAVENOUS
  Filled 2023-03-20 (×2): qty 10

## 2023-03-20 MED ORDER — LOSARTAN POTASSIUM 50 MG PO TABS
50.0000 mg | ORAL_TABLET | Freq: Every day | ORAL | Status: DC
  Administered 2023-03-21 – 2023-03-22 (×2): 50 mg via ORAL
  Filled 2023-03-20 (×2): qty 1

## 2023-03-20 MED ORDER — IOHEXOL 350 MG/ML SOLN
75.0000 mL | Freq: Once | INTRAVENOUS | Status: AC | PRN
  Administered 2023-03-20: 75 mL via INTRAVENOUS

## 2023-03-20 MED ORDER — LACTATED RINGERS IV SOLN: INTRAVENOUS | Status: AC

## 2023-03-20 MED ORDER — CARVEDILOL 12.5 MG PO TABS
12.5000 mg | ORAL_TABLET | Freq: Two times a day (BID) | ORAL | Status: DC
  Administered 2023-03-20 – 2023-03-23 (×7): 12.5 mg via ORAL
  Filled 2023-03-20 (×7): qty 1

## 2023-03-20 NOTE — Progress Notes (Signed)
 Daily Progress Note Intern Pager: [phone]  Patient name: Gina Gray Medical record number: [id number] Date of birth: [birth year] Age: 38 y.o. Gender: female  Primary Care Provider: Silvano Angeline FALCON, NP Consultants: GI s/o Code Status: Full  Pt Overview and Major Events to Date:  12/30: Admitted to FMTS  Assessment and Plan:  Ms. Pankowski is a 38 yo woman w/ a PMH of AUD, OUD on Suboxone , and HTN, admitted for pancreatitis likely in the setting of AUD with component of hypertriglyceridemia. Pain has not been well-controlled thus far. Will obtain additional workup including blood culture and CT Abd. Hypertensive with headache this morning. Decreased IVF rate.    Assessment & Plan Pancreatitis Pain overall not well-controlled. Continued nausea, has been NPO. CBC without leukocytosis, though WBC did increase.  -- Obtain blood cultures -- CT Abd to reassess pancreas -- Decreased IVF to 100ml/hr  - Cont pain regimen: Sch Tylenol  PO 1000mg  q6h, IV Dilaudid  q2h prn - Cont Suboxone  2-0.5 q6h  - Cont fenofibrate  for hypertriglyceridemia  -- Recheck triglycerides this afternoon - consider IV insulin as needed - IV Zofran  every 6 hours as needed Alcohol use disorder Last drink was 12/29 at 10 AM.  Received Ativan  1 mg overnight for elevated CIWA's to 5.  - D/c CIWA/Ativan  protocol - Hydroxyzine  as needed for anxiety - Continue folic acid , multivitamin, thiamine  supplementation when patient is able to tolerate PO HTN (hypertension) BP continues to be elevated. Symptomatic this morning with headache. Has been receiving home lisinopril  and coreg .  -D/c Lisinopril  due to possible association with pancreatitis -Start losartan  50 daily tomorrow  -Cont Coreg  12.5 mg daily -Decreased IVF per above -Hydralazine PRN for >180/100  Transaminitis Stable to improving this morning, will continue to monitor. - am CMP  Chronic and Stable Issues: Opioid dependence: Pain regimen as above MDD:  Continue Sertraline  150 mg HTN: Continue lisinopril  20 mg daily and Coreg  12.5 mg twice daily  FEN/GI: NPO PPx: Lovenox  Dispo:Home pending clinical improvement.   Subjective:  Abdominal/back pain remains consistent. Continued nausea, able to keep medicines down by mouth. Headache overnight, feeling hot and cold.   Objective: Temp:  [98.1 F (36.7 C)-99.7 F (37.6 C)] 99.7 F (37.6 C) (01/03 0634) Pulse Rate:  [62-87] 87 (01/03 0634) Resp:  [16-18] 18 (01/03 0449) BP: (143-182)/(91-118) 165/110 (01/03 0634) SpO2:  [90 %-97 %] 90 % (01/03 0449) Physical Exam: General: Uncomfortable-appearing.  CV: Normal S1/S2. No extra heart sounds. Warm and well-perfused. Pulm: Breathing comfortably on room air. CTAB. No increased WOB. Abd: BS present. Distended most prominently in epigastric and upper quadrants. Tender to light palpation of epigastric and upper quadrants.  Skin:  Warm, dry. Psych: Pleasant and appropriate.   Laboratory: Most recent CBC Lab Results  Component Value Date   WBC 4.1 03/17/2023   HGB 14.2 03/17/2023   HCT 41.0 03/17/2023   MCV 91.7 03/17/2023   PLT 168 03/17/2023   CMP     Component Value Date/Time   NA 136 03/19/2023 1942   K 3.8 03/19/2023 1942   CL 103 03/19/2023 1942   CO2 25 03/19/2023 1942   GLUCOSE 133 (H) 03/19/2023 1942   BUN <5 (L) 03/19/2023 1942   CREATININE 0.67 03/19/2023 1942   CALCIUM 9.0 03/19/2023 1942   PROT 6.8 03/19/2023 1942   ALBUMIN 3.2 (L) 03/19/2023 1942   AST 92 (H) 03/19/2023 1942   ALT 67 (H) 03/19/2023 1942   ALKPHOS 113 03/19/2023 1942   BILITOT 1.0 03/19/2023  1942   GFRNONAA >60 03/19/2023 1942     Diona Perkins, MD 03/20/2023, 7:10 AM  PGY-1, [facility] FPTS Intern pager: [phone], text pages welcome Secure chat group [facility] Teaching Service

## 2023-03-20 NOTE — Assessment & Plan Note (Addendum)
 BP continues to be elevated. Symptomatic this morning with headache. Has been receiving home lisinopril  and coreg .  -D/c Lisinopril  due to possible association with pancreatitis -Start losartan  50 daily tomorrow  -Cont Coreg  12.5 mg daily -Decreased IVF per above -Hydralazine PRN for >180/100

## 2023-03-20 NOTE — Assessment & Plan Note (Addendum)
 Last drink was 12/29 at 10 AM.  Received Ativan 1 mg overnight for elevated CIWA's to 5.  - D/c CIWA/Ativan protocol - Hydroxyzine as needed for anxiety - Continue folic acid, multivitamin, thiamine supplementation when patient is able to tolerate PO

## 2023-03-20 NOTE — Assessment & Plan Note (Addendum)
 Stable to improving this morning, will continue to monitor. - am CMP

## 2023-03-20 NOTE — Assessment & Plan Note (Addendum)
 Pain overall not well-controlled. Continued nausea, has been NPO. CBC without leukocytosis, though WBC did increase.  -- Obtain blood cultures -- CT Abd to reassess pancreas -- Decreased IVF to 100ml/hr  - Cont pain regimen: Sch Tylenol  PO 1000mg  q6h, IV Dilaudid  q2h prn - Cont Suboxone  2-0.5 q6h  - Cont fenofibrate  for hypertriglyceridemia  -- Recheck triglycerides this afternoon - consider IV insulin as needed - IV Zofran  every 6 hours as needed

## 2023-03-20 NOTE — Plan of Care (Signed)
 FMTS Brief Progress Note  S:Notified via secure chat by pt's RN, Melissa that BP was elevated 162/103. Reports pt told RN that her head felt full and she felt puffy. Went to evaluate pt at bedside. She reports that she has had an intermittent headache throughout the day, her current headache is unchanged. Denies chest pain, dizziness, difficulty breathing. Reports she has been urinating appropriately.    O: BP (!) 162/103 (BP Location: Right Arm)   Pulse 62   Temp 98.1 F (36.7 C) (Oral)   Resp 18   Ht 5' 8 (1.727 m)   Wt 131.3 kg   SpO2 93%   BMI 44.01 kg/m   Gen: well appearing, talkative, no acute distress CV: RRR, no m/r/g Pulm: Normal work of breathing on RA. Clear to auscultation bilaterally. No crackles auscultated. Good air movement throughout. MSK: No significant peripheral edema  A/P: HTN Suspect 2/2 pain. Has been receiving aggressive IVF in setting of acute pancreatitis unable to tolerate PO. Pt reports good urinary output despite none documented. Appears euvolemic on exam, normal lung exam with no respiratory symptoms or significant swelling. Of note, had two elevated readings during the day yesterday. Will defer to day team regarding any BP medication adjustments.  - Notified RN to please document I/O tonight and recheck BP at scheduled time - advised to let me know of any BP >180/120 or new/worsening pt reported symptoms - Continue lisinopril  20mg  every day, coreg  12.5mg  BID for now - reordered IVF as the order had not been continued today  - Orders reviewed. Labs for AM ordered, which was adjusted as needed.   Izabell Schalk, DO 03/20/2023, 12:49 AM PGY-1, [facility] Night Resident  Please page [phone] with questions.

## 2023-03-21 DIAGNOSIS — R7401 Elevation of levels of liver transaminase levels: Secondary | ICD-10-CM | POA: Diagnosis not present

## 2023-03-21 DIAGNOSIS — K852 Alcohol induced acute pancreatitis without necrosis or infection: Secondary | ICD-10-CM | POA: Diagnosis not present

## 2023-03-21 DIAGNOSIS — F1090 Alcohol use, unspecified, uncomplicated: Secondary | ICD-10-CM | POA: Diagnosis not present

## 2023-03-21 LAB — COMPREHENSIVE METABOLIC PANEL
ALT: 50 U/L — ABNORMAL HIGH (ref 0–44)
AST: 48 U/L — ABNORMAL HIGH (ref 15–41)
Albumin: 3.3 g/dL — ABNORMAL LOW (ref 3.5–5.0)
Alkaline Phosphatase: 100 U/L (ref 38–126)
Anion gap: 9 (ref 5–15)
BUN: <5 mg/dL — ABNORMAL LOW (ref 6–20)
CO2: 27 mmol/L (ref 22–32)
Calcium: 9.1 mg/dL (ref 8.9–10.3)
Chloride: 100 mmol/L (ref 98–111)
Creatinine, Ser: 0.76 mg/dL (ref 0.44–1.00)
GFR, Estimated: >60 mL/min (ref >60)
Glucose, Bld: 100 mg/dL — ABNORMAL HIGH (ref 70–99)
Potassium: 3.8 mmol/L (ref 3.5–5.1)
Sodium: 136 mmol/L (ref 135–145)
Total Bilirubin: 1.1 mg/dL (ref 0.0–1.2)
Total Protein: 6.8 g/dL (ref 6.5–8.1)

## 2023-03-21 LAB — CBC
HCT: 35.7 % — ABNORMAL LOW (ref 36.0–46.0)
Hemoglobin: 12.5 g/dL (ref 12.0–15.0)
MCH: 32.4 pg (ref 26.0–34.0)
MCHC: 35 g/dL (ref 30.0–36.0)
MCV: 92.5 fL (ref 80.0–100.0)
Platelets: 160 10*3/uL (ref 150–400)
RBC: 3.86 MIL/uL — ABNORMAL LOW (ref 3.87–5.11)
RDW: 12.7 % (ref 11.5–15.5)
WBC: 6.1 10*3/uL (ref 4.0–10.5)
nRBC: 0 % (ref 0.0–0.2)

## 2023-03-21 MED ORDER — HYDROMORPHONE HCL 2 MG PO TABS
2.0000 mg | ORAL_TABLET | ORAL | Status: DC | PRN
  Administered 2023-03-21 – 2023-03-22 (×4): 2 mg via ORAL
  Filled 2023-03-21 (×4): qty 1

## 2023-03-21 MED ORDER — PANTOPRAZOLE SODIUM 40 MG PO TBEC
40.0000 mg | DELAYED_RELEASE_TABLET | Freq: Every day | ORAL | Status: DC
  Administered 2023-03-22 – 2023-03-23 (×2): 40 mg via ORAL
  Filled 2023-03-21 (×2): qty 1

## 2023-03-21 NOTE — Assessment & Plan Note (Deleted)
 Stable to improving this morning, will continue to monitor. - am CMP

## 2023-03-21 NOTE — Assessment & Plan Note (Addendum)
 Fluctuating blood pressures.  Starting losartan 50 mg today, continue Coreg 12.5 mg daily.  Anticipate improvement with discontinuation of IV fluids and starting losartan.

## 2023-03-21 NOTE — Progress Notes (Signed)
     Daily Progress Note Intern Pager: [phone]  Patient name: Gina Gray Medical record number: [id number] Date of birth: 1985/03/19 Age: 37 y.o. Gender: female  Primary Care Provider: Silvano Angeline FALCON, NP Consultants: GI s/o Code Status: Full  Pt Overview and Major Events to Date:  12/30: Admitted to FMTS  Assessment and Plan:  38 year old woman with past med history of AUD, OUD on Suboxone , and hypertension admitted for pancreatitis.  Patient remains admitted for pain control and advancing diet.  Reassuringly, repeat CT abdomen with contrast showed improving pancreatitis.  Triglycerides trending down.  IVF discontinued early this morning. Assessment & Plan Pancreatitis Patient still endorsing pain.  Tolerating sips with meds.  Repeat imaging and triglycerides unremarkable.  Blood cultures were obtained yesterday. - Follow-up blood cultures - Discontinue IV fluids - Change all meds to p.o. - Encourage p.o. - Soft diet, advance as tolerated - Continue pain regimen: Scheduled Tylenol  p.o. 1000 mg every 6 hours - Transition to p.o. Dilaudid  2 mg every 4 hours as needed -  PO Zofran  every 6 hours as needed Alcohol use disorder - Continue folic acid , multivitamin, thiamine  supplementation HTN (hypertension) Fluctuating blood pressures.  Starting losartan  50 mg today, continue Coreg  12.5 mg daily.  Anticipate improvement with discontinuation of IV fluids and starting losartan .  Chronic and Stable Problems:  Opioid dependence: Pain regimen as above MDD: Continue Sertraline  150 mg  FEN/GI: Dysphagia 3 PPx: Lovenox  Dispo: Home pending clinical improvement    Subjective:  Patient reports that she has had some fluids to drink, she is holding down medications.  Reports she has not yet eaten solids.  She still endorses pain.  Objective: Temp:  [98.1 F (36.7 C)-98.6 F (37 C)] 98.6 F (37 C) (01/04 0451) Pulse Rate:  [68-81] 81 (01/04 0451) Resp:  [17-18] 18 (01/04 0451) BP:  (128-153)/(81-102) 153/102 (01/04 0451) SpO2:  [87 %-93 %] 87 % (01/04 0451) Physical Exam: General: NAD, chronically ill-appearing Cardiovascular: RRR, no murmurs Respiratory: CTAB, normal work of breathing on room air Abdomen: Soft, mild tenderness to palpation in epigastric/right upper quadrant and left lower quadrant. Extremities: Moving all 4 extremities  Laboratory: Most recent CBC Lab Results  Component Value Date   WBC 6.1 03/21/2023   HGB 12.5 03/21/2023   HCT 35.7 (L) 03/21/2023   MCV 92.5 03/21/2023   PLT 160 03/21/2023   Most recent BMP    Latest Ref Rng & Units 03/20/2023    8:00 AM  BMP  Glucose 70 - 99 mg/dL 898   BUN 6 - 20 mg/dL <5   Creatinine 9.55 - 1.00 mg/dL 9.26   Sodium 864 - 854 mmol/L 135   Potassium 3.5 - 5.1 mmol/L 3.8   Chloride 98 - 111 mmol/L 102   CO2 22 - 32 mmol/L 24   Calcium 8.9 - 10.3 mg/dL 9.2     Howell Lunger, DO 03/21/2023, 7:57 AM  PGY-2,  Family Medicine FPTS Intern pager: [phone], text pages welcome Secure chat group [facility] Teaching Service

## 2023-03-21 NOTE — Assessment & Plan Note (Addendum)
 Patient still endorsing pain.  Tolerating sips with meds.  Repeat imaging and triglycerides unremarkable.  Blood cultures were obtained yesterday. - Follow-up blood cultures - Discontinue IV fluids - Change all meds to p.o. - Encourage p.o. - Soft diet, advance as tolerated - Continue pain regimen: Scheduled Tylenol  p.o. 1000 mg every 6 hours - Transition to p.o. Dilaudid  2 mg every 4 hours as needed -  PO Zofran  every 6 hours as needed

## 2023-03-21 NOTE — Assessment & Plan Note (Addendum)
-   Continue folic acid, multivitamin, thiamine supplementation

## 2023-03-21 NOTE — Plan of Care (Signed)

## 2023-03-22 ENCOUNTER — Inpatient Hospital Stay (HOSPITAL_COMMUNITY): Payer: Medicaid Other

## 2023-03-22 DIAGNOSIS — R0902 Hypoxemia: Secondary | ICD-10-CM | POA: Insufficient documentation

## 2023-03-22 DIAGNOSIS — M7989 Other specified soft tissue disorders: Secondary | ICD-10-CM | POA: Diagnosis not present

## 2023-03-22 DIAGNOSIS — R7401 Elevation of levels of liver transaminase levels: Secondary | ICD-10-CM | POA: Diagnosis not present

## 2023-03-22 DIAGNOSIS — L539 Erythematous condition, unspecified: Secondary | ICD-10-CM

## 2023-03-22 MED ORDER — PROCHLORPERAZINE EDISYLATE 10 MG/2ML IJ SOLN
10.0000 mg | Freq: Once | INTRAMUSCULAR | Status: AC
  Administered 2023-03-22: 10 mg via INTRAVENOUS
  Filled 2023-03-22: qty 2

## 2023-03-22 MED ORDER — BACLOFEN 10 MG PO TABS: 10.0000 mg | ORAL_TABLET | Freq: Once | ORAL | Status: DC | PRN

## 2023-03-22 MED ORDER — DIPHENHYDRAMINE HCL 25 MG PO CAPS
25.0000 mg | ORAL_CAPSULE | Freq: Once | ORAL | Status: AC
  Administered 2023-03-22: 25 mg via ORAL
  Filled 2023-03-22: qty 1

## 2023-03-22 MED ORDER — IBUPROFEN 200 MG PO TABS
400.0000 mg | ORAL_TABLET | Freq: Once | ORAL | Status: AC
  Administered 2023-03-22: 400 mg via ORAL
  Filled 2023-03-22: qty 2

## 2023-03-22 MED ORDER — LIDOCAINE 5 % EX PTCH
1.0000 | MEDICATED_PATCH | CUTANEOUS | Status: DC
  Administered 2023-03-22: 1 via TRANSDERMAL
  Filled 2023-03-22: qty 1

## 2023-03-22 NOTE — Plan of Care (Signed)

## 2023-03-22 NOTE — Plan of Care (Signed)

## 2023-03-22 NOTE — Plan of Care (Addendum)
 FMTS Interim Progress Note  S: Went to bedside to discuss patient's headache and blood pressure medications as nursing had message me about.  States that she has been having worsening headache over the past 2 days since her lisinopril  has been discontinued.  Does have some nausea. We did discuss that her lisinopril  was replaced with a higher dose of an ARB called losartan  and blood pressures have been improved.  She states that she would like to be back on her lisinopril  instead given she has been on it for a long period of time.  We discussed that lisinopril  has been shown to have some triggers with pancreatitis but she states that she would rather be on it again then be on losartan .  She denies any abdominal pain has been eating/drinking well.  She states that she has some back pain on her sides and between shoulder blades.  We discussed that it is not good care to continue opioid medications after pancreatitis has resolved.  We can try other medications to help with her headache and back pain.  We discussed that if she is having abdominal pain that is worsening that she should let her nurse know and that we would have to restart from the beginning and make her n.p.o. in order to receive IV or opiate pain meds.  Will start with low-dose baclofen , lidocaine  patch, Compazine , Benadryl , NSAID for headache cocktail and back pain.  Her blood pressure is not at an endorgan damage threshold which I discussed with patient-low likelihood of intracranial process given blood pressure 149/82.  She prefers observation overnight to monitor pain  Christia Budds, MD 03/22/2023, 4:11 PM PGY-3, [facility] Family Medicine Service pager [phone]

## 2023-03-22 NOTE — Progress Notes (Addendum)
 Made pt aware her dilaudid  is now Discontinued.

## 2023-03-22 NOTE — Progress Notes (Addendum)
 Daily Progress Note Intern Pager: [phone]  Patient name: Gina Gray Medical record number: [id number] Date of birth: 06/26/1985 Age: 38 y.o. Gender: female  Primary Care Provider: Silvano Angeline FALCON, NP Consultants: GI s/o Code Status: Full   Pt Overview and Major Events to Date:  12/30: Admitted to FMTS   Assessment and Plan:   38 year old woman with past med history of AUD, OUD on Suboxone , and hypertension admitted for pancreatitis.  Patient remains admitted for pain control and advancing diet.  Reassuringly, repeat CT abdomen with contrast showed improving pancreatitis.  Has transitioned off IVF, now on regular diet and tolerating.   Assessment & Plan Pancreatitis Patient's pain remains consistent. Advanced diet to regular this morning, no vomiting.  Repeat imaging and triglycerides unremarkable.  Blood cultures NG<24.  - Continue Tylenol  1000 q6, Suboxone  2tab q6 -- Discontinue PO Dilaudid  while tolerating PO  -- Adjust pain medication accordingly if pain/PO intake worsens -- Fenofibrate  160 daily  -- Protonix  40 daily  -  Regular diet  -  PO Zofran  every 6 hours as needed Alcohol use disorder - Continue folic acid , multivitamin, thiamine  supplementation HTN (hypertension) Blood pressures 150-160s/90s this morning. Reported headache. No current chest pain, new extremity swelling.  - Continue losartan  50 mg, Coreg  12.5 mg daily.   - Pain regimen as above for headache IV site erythema Area of erythema, warmth, and induration around previous site of IV near L antecubital fossa. Afebrile at this time.  - LUE DVT US  pending  Hypoxia SpO2 in high 80s this morning. Intermittent SOB with chronic intermittent chest tightness. Last echo in July unremarkable. No home O2 or breathing treatments.  - Recheck O2, supplement as needed - CXR ordered  Chronic and Stable Problems:  Opioid dependence: Pain regimen as above MDD: Continue Sertraline  150 mg, Atarax  prn    FEN/GI:  Regular PPx: Lovenox  Dispo: Home pending clinical improvement    Subjective:  Abdominal pain remains largely consistent. Has global headache, no vision changes or active chest pain. Some shortness of breath when ambulating. Intermittent chest tightness that lasts for minutes at a time over the course of several months now. Able to tolerate PO without emesis. Improving nausea.   Objective: Temp:  [98 F (36.7 C)-99.1 F (37.3 C)] 98 F (36.7 C) (01/05 0721) Pulse Rate:  [74-87] 76 (01/05 0721) Resp:  [17-19] 18 (01/05 0721) BP: (122-162)/(81-98) 155/98 (01/05 0721) SpO2:  [88 %-91 %] 88 % (01/05 0721) Physical Exam: General: No acute distress. CV: Normal S1/S2. No extra heart sounds. Warm and well-perfused. Pulm: Breathing comfortably on room air. CTAB. No increased WOB. Abd: Distention and tenderness to palpation of epigastric and upper quadrants. Soft, nontender lower quadrants.  Skin: Tender area of erythema, warmth, and induration surrounding previous IV site near L antecubital fossa. Intact L radial pulse.  Neuro: Alert and oriented. PEERL.  Psych: Pleasant and appropriate.    Laboratory: Most recent CBC Lab Results  Component Value Date   WBC 6.1 03/21/2023   HGB 12.5 03/21/2023   HCT 35.7 (L) 03/21/2023   MCV 92.5 03/21/2023   PLT 160 03/21/2023   Most recent BMP    Latest Ref Rng & Units 03/21/2023    7:04 AM  BMP  Glucose 70 - 99 mg/dL 899   BUN 6 - 20 mg/dL <5   Creatinine 9.55 - 1.00 mg/dL 9.23   Sodium 864 - 854 mmol/L 136   Potassium 3.5 - 5.1 mmol/L 3.8   Chloride 98 -  111 mmol/L 100   CO2 22 - 32 mmol/L 27   Calcium 8.9 - 10.3 mg/dL 9.1    Blood cx: WH<75  Seven Dollens, MD 03/22/2023, 7:34 AM  PGY-1, [facility] Family [facility] FPTS Intern pager: [phone], text pages welcome Secure chat group [facility] Teaching Service

## 2023-03-22 NOTE — Assessment & Plan Note (Addendum)
 Patient's pain remains consistent. Advanced diet to regular this morning, no vomiting.  Repeat imaging and triglycerides unremarkable.  Blood cultures NG<24.  - Continue Tylenol  1000 q6, Suboxone  2tab q6 -- Discontinue PO Dilaudid  while tolerating PO  -- Adjust pain medication accordingly if pain/PO intake worsens -- Fenofibrate  160 daily  -- Protonix  40 daily  -  Regular diet  -  PO Zofran  every 6 hours as needed

## 2023-03-22 NOTE — Assessment & Plan Note (Signed)
-   Continue folic acid, multivitamin, thiamine supplementation

## 2023-03-22 NOTE — Assessment & Plan Note (Signed)
 SpO2 in high 80s this morning. Intermittent SOB with chronic intermittent chest tightness. Last echo in July unremarkable. No home O2 or breathing treatments.  - Recheck O2, supplement as needed - CXR ordered

## 2023-03-22 NOTE — Progress Notes (Signed)
 LUE venous duplex has been completed.    Results can be found under chart review under CV PROC. 03/22/2023 5:08 PM Brigit Doke RVT, RDMS

## 2023-03-22 NOTE — Assessment & Plan Note (Addendum)
 Blood pressures 150-160s/90s this morning. Reported headache. No current chest pain, new extremity swelling.  - Continue losartan 50 mg, Coreg 12.5 mg daily.   - Pain regimen as above for headache

## 2023-03-22 NOTE — Assessment & Plan Note (Addendum)
 Area of erythema, warmth, and induration around previous site of IV near L antecubital fossa. Afebrile at this time.  - LUE DVT US pending

## 2023-03-23 ENCOUNTER — Other Ambulatory Visit (HOSPITAL_COMMUNITY): Payer: Self-pay

## 2023-03-23 DIAGNOSIS — R7401 Elevation of levels of liver transaminase levels: Secondary | ICD-10-CM | POA: Diagnosis not present

## 2023-03-23 MED ORDER — THIAMINE HCL 100 MG PO TABS
100.0000 mg | ORAL_TABLET | Freq: Every day | ORAL | 0 refills | Status: AC
  Filled 2023-03-23: qty 30, 30d supply, fill #0

## 2023-03-23 MED ORDER — LISINOPRIL 20 MG PO TABS
20.0000 mg | ORAL_TABLET | Freq: Every day | ORAL | Status: DC
  Administered 2023-03-23: 20 mg via ORAL
  Filled 2023-03-23: qty 1

## 2023-03-23 MED ORDER — FOLIC ACID 1 MG PO TABS
1.0000 mg | ORAL_TABLET | Freq: Every day | ORAL | 0 refills | Status: AC
  Filled 2023-03-23: qty 30, 30d supply, fill #0

## 2023-03-23 MED ORDER — ACETAMINOPHEN 500 MG PO TABS
1000.0000 mg | ORAL_TABLET | Freq: Four times a day (QID) | ORAL | 0 refills | Status: AC
  Filled 2023-03-23: qty 30, 4d supply, fill #0

## 2023-03-23 MED ORDER — PANTOPRAZOLE SODIUM 40 MG PO TBEC: 40.0000 mg | DELAYED_RELEASE_TABLET | Freq: Every day | ORAL | Status: DC

## 2023-03-23 MED ORDER — ADULT MULTIVITAMIN W/MINERALS CH
1.0000 | ORAL_TABLET | Freq: Every day | ORAL | 0 refills | Status: AC
  Filled 2023-03-23: qty 30, 30d supply, fill #0

## 2023-03-23 MED ORDER — ADULT MULTIVITAMIN W/MINERALS CH: 1.0000 | ORAL_TABLET | Freq: Every day | ORAL | Status: DC

## 2023-03-23 MED ORDER — FOLIC ACID 1 MG PO TABS: 1.0000 mg | ORAL_TABLET | Freq: Every day | ORAL | Status: DC

## 2023-03-23 MED ORDER — PANTOPRAZOLE SODIUM 40 MG PO TBEC
40.0000 mg | DELAYED_RELEASE_TABLET | Freq: Every day | ORAL | 0 refills | Status: AC
  Filled 2023-03-23: qty 30, 30d supply, fill #0

## 2023-03-23 NOTE — Assessment & Plan Note (Deleted)
 SpO2 89 this morning. Intermittent SOB with chronic intermittent chest tightness. Last echo in July unremarkable. No home O2 or breathing treatments. CXR with low volume chest without consolidation.  - Supplement O2 as needed

## 2023-03-23 NOTE — Progress Notes (Signed)
 Discharge instructions reviewed with pt. Pt had no questions at this time, substance abuse resources/counseling information included in her discharge information.  Copy of instructions given to pt. [facility] is filling scripts for pt and will be picked up or delivered to the discharge lounge where pt will go while she waits for her ride. Pt getting dressed at this time.  Pt will be d/c'd via wheelchair with belongings to the discharge lounge and will be escorted by staff.   Doyl Bitting,RN SWOT

## 2023-03-23 NOTE — Discharge Summary (Addendum)
 Family Medicine Teaching [facility] Discharge Summary  Patient name: Gina Gray Medical record number: [id number] Date of birth: 1985-07-15 Age: 38 y.o. Gender: female Date of Admission: 03/15/2023  Date of Discharge: 03/23/23 Admitting Physician: Rollene FORBES Keeling, MD  Primary Care Provider: Silvano Angeline FALCON, NP Consultants: GI s/o  Indication for Hospitalization: Pancreatitis  Discharge Diagnoses/Problem List:  Principal Problem for Admission: Pancreatitis Other Problems addressed during stay:  Principal Problem:   Pancreatitis Active Problems:   High triglycerides   HTN (hypertension)   Alcohol use disorder   Transaminitis   IV site erythema   Hypoxia   Brief Hospital Course:  Gina Gray is a 38 y.o.female with a history of OUD on suboxone , alcohol use disorder, history of severe necrotizing pancreatitis in 2023 who was admitted to the family medicine teaching Service at [facility] for epigastric/RUQ abdominal pain in the setting of pancreatitis. Her hospital course is detailed below:  Pancreatitis Likely in the setting of alcohol use disorder as patient reports drinking 1/5 of liquor daily x 5 months. Initial CT Abd suggestive of pancreatitis, RUQ US  without gallstones. Patient started on 1.5 MIVF and IV pain medication in addition to home Suboxone  (for OUD) for management. Triglycerides elevated >600, started on fenofibrate  per GI. Due persistent pain and Tmax 100.3 during admission, blood cultures were drawn and patient had repeat CT Abdomen which demonstrated improving pancreatitis. Thereafter, patient weaned from IVF and transitioned to tylenol  and suboxone  only for pain management. By the time of discharge, patient was tolerating PO regular diet well. Discharged on tylenol  and home suboxone . At time of discharge, blood cultures were NG 3 days, still pending final results.   AUD Daily heavy drinking as above. CIWA protocol used during admission, patient required  Ativan  intermittently until 03/20/23. Patient received folic acid , multivitamin, thiamine  supplementation during admission.   Hypoxia  Intermittent desats to high 80s during admission. Remained on RA throughout admission. CXR with low volume chest, without pneumonia or edema. By time of discharge, patient remained stable on room air.   Other chronic conditions were medically managed with home medications and formulary alternatives as necessary (IUD, MDD, HTN)  PCP Follow-up Recommendations: Alcohol use cessation resources GI follow-up for recurrent pancreatitis Recommend sleep study outpatient Fu elevated cholesterol   Disposition: Home  Discharge Condition: Improved and stable  Discharge Exam:  Vitals:   03/23/23 0950 03/23/23 1000  BP: (!) 154/96   Pulse: 76   Resp:    Temp:    SpO2: 94% 93%   General: Well-appearing. Resting comfortably in room. CV: Normal S1/S2. No extra heart sounds. Warm and well-perfused. Pulm: Breathing comfortably on room air. CTAB. No increased WOB. Abd: Improving epigastric tenderness. Improving distention. BS present.  Ext: No LE edema or tenderness.  Skin:  Warm, dry. Psych: Pleasant and appropriate.   Significant Procedures: N/a  Significant Labs and Imaging:     Latest Ref Rng & Units 03/21/2023    7:04 AM 03/20/2023    8:00 AM 03/19/2023    7:42 PM  CMP  Glucose 70 - 99 mg/dL 899  898  866   BUN 6 - 20 mg/dL <5  <5  <5   Creatinine 0.44 - 1.00 mg/dL 9.23  9.26  9.32   Sodium 135 - 145 mmol/L 136  135  136   Potassium 3.5 - 5.1 mmol/L 3.8  3.8  3.8   Chloride 98 - 111 mmol/L 100  102  103   CO2 22 - 32 mmol/L  27  24  25    Calcium 8.9 - 10.3 mg/dL 9.1  9.2  9.0   Total Protein 6.5 - 8.1 g/dL 6.8  6.9  6.8   Total Bilirubin 0.0 - 1.2 mg/dL 1.1  0.8  1.0   Alkaline Phos 38 - 126 U/L 100  114  113   AST 15 - 41 U/L 48  88  92   ALT 0 - 44 U/L 50  66  67       Latest Ref Rng & Units 03/21/2023    7:04 AM 03/20/2023   10:33 AM 03/17/2023    11:27 AM  CBC  WBC 4.0 - 10.5 K/uL 6.1  7.4  4.1   Hemoglobin 12.0 - 15.0 g/dL 87.4  86.9  85.7   Hematocrit 36.0 - 46.0 % 35.7  36.9  41.0   Platelets 150 - 400 K/uL 160  163  168    Lipase     Component Value Date/Time   LIPASE 57 (H) 03/15/2023 2136   Triglycerides: 265  Lipid Panel     Component Value Date/Time   CHOL 245 (H) 03/17/2023 0632   TRIG 265 (H) 03/20/2023 1150   HDL 32 (L) 03/17/2023 0632   CHOLHDL 7.7 03/17/2023 0632   VLDL UNABLE TO CALCULATE IF TRIGLYCERIDE OVER 400 mg/dL 87/68/7975 9367   LDLCALC UNABLE TO CALCULATE IF TRIGLYCERIDE OVER 400 mg/dL 87/68/7975 9367   LDLDIRECT 137 (H) 03/17/2023 0632    EKG 12/30: EKG NSR no ST segment changes or TWI   RUQ US  12/29: Advanced hepatic steatosis. No gallstones. Negative Murphys.  CT Abd 03/20/23: Improving pancreatitis without abscess or necrosis. Passive atelectasis.  CXR 1/5: Low volume chest without pneumonia or edema  Results/Tests Pending at Time of Discharge: Blood cultures NG 3d  Discharge Medications:  Allergies as of 03/23/2023       Reactions   Zosyn [piperacillin Sod-tazobactam So] Other (See Comments)   Fever        Medication List     TAKE these medications    Acetaminophen  Extra Strength 500 MG Tabs Take 2 tablets (1,000 mg total) by mouth every 6 (six) hours.   Buprenorphine  HCl-Naloxone  HCl 8-2 MG Film Place 1 Film under the tongue in the morning and at bedtime.   carvedilol  12.5 MG tablet Commonly known as: COREG  Take 12.5 mg by mouth 2 (two) times daily with a meal.   CertaVite/Antioxidants Tabs Take 1 tablet by mouth daily. Start taking on: March 24, 2023   folic acid  1 MG tablet Commonly known as: FOLVITE  Take 1 tablet (1 mg total) by mouth daily. Start taking on: March 24, 2023   lisinopril  20 MG tablet Commonly known as: ZESTRIL  Take 20 mg by mouth daily.   pantoprazole  40 MG tablet Commonly known as: PROTONIX  Take 1 tablet (40 mg total) by mouth daily. Start  taking on: March 24, 2023   sertraline  100 MG tablet Commonly known as: ZOLOFT  Take 150 mg by mouth daily.   thiamine  100 MG tablet Commonly known as: VITAMIN B1 Take 1 tablet (100 mg total) by mouth daily. Start taking on: March 24, 2023        Discharge Instructions: Please refer to Patient Instructions section of EMR for full details.  Patient was counseled important signs and symptoms that should prompt return to medical care, changes in medications, dietary instructions, activity restrictions, and follow up appointments.   Follow-Up Appointments:  Follow-up Information     Silvano Angeline FALCON, NP.  Schedule an appointment as soon as possible for a visit in 1 week(s).   Contact information: [address] [place] [address] [phone]                 Diona Perkins, MD 03/23/2023, 3:56 PM PGY-1, [facility]  Upper Level Addendum: Reviewed the above note, making necessary revisions as appropriate. I agree with the medical decision making and physical exam as noted above. Gladis Church, DO PGY-2 [facility]

## 2023-03-23 NOTE — Assessment & Plan Note (Deleted)
-   Continue folic acid, multivitamin, thiamine supplementation

## 2023-03-23 NOTE — Assessment & Plan Note (Deleted)
 Area of erythema, warmth, and induration around previous site of IV near L antecubital fossa. Afebrile at this time.  LUE DVT US negative.

## 2023-03-23 NOTE — Assessment & Plan Note (Deleted)
 Blood pressures 140s/80s this morning. Reported headache***. No current chest pain, new extremity swelling.  - Continue losartan 50 mg***??, Coreg 12.5 mg daily.   - Pain regimen as above for headache

## 2023-03-23 NOTE — Assessment & Plan Note (Deleted)
 Patient's pain improving***. Tolerating regular diet, no vomiting.  Repeat imaging and triglycerides unremarkable.  Blood cultures NG2d.  - Continue Tylenol  1000 q6, Suboxone  2tab q6 -- Adjust pain medication accordingly if pain/PO intake worsens -- Fenofibrate  160 daily  -- Protonix  40 daily  -  Regular diet  -  PO Zofran  every 6 hours as needed

## 2023-03-23 NOTE — Discharge Instructions (Addendum)
 Dear Gina Gray,   Thank you so much for allowing us  to be part of your care!  You were admitted to [facility] for pancreatitis. Continue with Tylenol  and home medications.   Please follow up with your pcp within the next 2 weeks for hospital follow up  I would talk to your PCP about changing blood pressure medications as well  POST-HOSPITAL & CARE INSTRUCTIONS Follow up with PCP Please let PCP/Specialists know of any changes that were made.  Please see medications section of this packet for any medication changes.   DOCTOR'S APPOINTMENT & FOLLOW UP CARE INSTRUCTIONS  No future appointments.  RETURN PRECAUTIONS: -Worsening vomiting or nausea -Blood in vomit or stool -Fever or chills, chest pain or shortness of breath  Take care and be well!  Family Medicine Teaching Service  [place]  [facility]  [address] [place], [place] [address] [phone]

## 2023-03-25 LAB — CULTURE, BLOOD (ROUTINE X 2): Culture: NO GROWTH
